# Patient Record
Sex: Female | Born: 1970 | Race: White | Hispanic: No | Marital: Single | Smoking: Never smoker
Health system: Southern US, Community
[De-identification: ages and names within clinical notes are randomized; demographics above are authoritative.]

## PROBLEM LIST (undated history)

## (undated) DIAGNOSIS — C44509 Unspecified malignant neoplasm of skin of other part of trunk: Secondary | ICD-10-CM

## (undated) DIAGNOSIS — N879 Dysplasia of cervix uteri, unspecified: Secondary | ICD-10-CM

## (undated) DIAGNOSIS — C801 Malignant (primary) neoplasm, unspecified: Secondary | ICD-10-CM

## (undated) HISTORY — PX: PELVIC LAPAROSCOPY: SHX162

## (undated) HISTORY — PX: SKIN CANCER EXCISION: SHX779

## (undated) HISTORY — DX: Malignant (primary) neoplasm, unspecified: C80.1

## (undated) HISTORY — PX: CERVICAL BIOPSY  W/ LOOP ELECTRODE EXCISION: SUR135

## (undated) HISTORY — DX: Unspecified malignant neoplasm of skin of other part of trunk: C44.509

## (undated) HISTORY — DX: Dysplasia of cervix uteri, unspecified: N87.9

---

## 1998-07-15 ENCOUNTER — Other Ambulatory Visit: Admission: RE | Admit: 1998-07-15 | Discharge: 1998-07-15 | Payer: Self-pay | Admitting: Gynecology

## 1998-12-15 ENCOUNTER — Other Ambulatory Visit: Admission: RE | Admit: 1998-12-15 | Discharge: 1998-12-15 | Payer: Self-pay | Admitting: Gynecology

## 1999-07-21 ENCOUNTER — Other Ambulatory Visit: Admission: RE | Admit: 1999-07-21 | Discharge: 1999-07-21 | Payer: Self-pay | Admitting: Gynecology

## 1999-08-14 ENCOUNTER — Encounter (INDEPENDENT_AMBULATORY_CARE_PROVIDER_SITE_OTHER): Payer: Self-pay

## 1999-08-14 ENCOUNTER — Other Ambulatory Visit: Admission: RE | Admit: 1999-08-14 | Discharge: 1999-08-14 | Payer: Self-pay | Admitting: Gynecology

## 1999-10-09 ENCOUNTER — Encounter (INDEPENDENT_AMBULATORY_CARE_PROVIDER_SITE_OTHER): Payer: Self-pay | Admitting: Specialist

## 1999-10-09 ENCOUNTER — Other Ambulatory Visit: Admission: RE | Admit: 1999-10-09 | Discharge: 1999-10-09 | Payer: Self-pay | Admitting: Gynecology

## 1999-11-20 ENCOUNTER — Encounter (INDEPENDENT_AMBULATORY_CARE_PROVIDER_SITE_OTHER): Payer: Self-pay | Admitting: Specialist

## 1999-11-20 ENCOUNTER — Other Ambulatory Visit: Admission: RE | Admit: 1999-11-20 | Discharge: 1999-11-20 | Payer: Self-pay | Admitting: Gynecology

## 2000-03-29 ENCOUNTER — Other Ambulatory Visit: Admission: RE | Admit: 2000-03-29 | Discharge: 2000-03-29 | Payer: Self-pay | Admitting: Gynecology

## 2000-10-04 ENCOUNTER — Other Ambulatory Visit: Admission: RE | Admit: 2000-10-04 | Discharge: 2000-10-04 | Payer: Self-pay | Admitting: Gynecology

## 2001-04-24 ENCOUNTER — Other Ambulatory Visit: Admission: RE | Admit: 2001-04-24 | Discharge: 2001-04-24 | Payer: Self-pay | Admitting: Gynecology

## 2001-10-10 ENCOUNTER — Other Ambulatory Visit: Admission: RE | Admit: 2001-10-10 | Discharge: 2001-10-10 | Payer: Self-pay | Admitting: Gynecology

## 2001-11-17 ENCOUNTER — Encounter (INDEPENDENT_AMBULATORY_CARE_PROVIDER_SITE_OTHER): Payer: Self-pay | Admitting: Specialist

## 2001-11-17 ENCOUNTER — Ambulatory Visit (HOSPITAL_COMMUNITY): Admission: RE | Admit: 2001-11-17 | Discharge: 2001-11-17 | Payer: Self-pay

## 2002-06-01 ENCOUNTER — Other Ambulatory Visit: Admission: RE | Admit: 2002-06-01 | Discharge: 2002-06-01 | Payer: Self-pay | Admitting: Gynecology

## 2003-06-06 ENCOUNTER — Other Ambulatory Visit: Admission: RE | Admit: 2003-06-06 | Discharge: 2003-06-06 | Payer: Self-pay | Admitting: Gynecology

## 2004-05-21 ENCOUNTER — Ambulatory Visit: Payer: Self-pay | Admitting: Gynecology

## 2004-06-08 ENCOUNTER — Other Ambulatory Visit: Admission: RE | Admit: 2004-06-08 | Discharge: 2004-06-08 | Payer: Self-pay | Admitting: Gynecology

## 2005-06-10 ENCOUNTER — Other Ambulatory Visit: Admission: RE | Admit: 2005-06-10 | Discharge: 2005-06-10 | Payer: Self-pay | Admitting: Gynecology

## 2006-08-25 ENCOUNTER — Other Ambulatory Visit: Admission: RE | Admit: 2006-08-25 | Discharge: 2006-08-25 | Payer: Self-pay | Admitting: Gynecology

## 2007-08-28 ENCOUNTER — Other Ambulatory Visit: Admission: RE | Admit: 2007-08-28 | Discharge: 2007-08-28 | Payer: Self-pay | Admitting: Gynecology

## 2008-08-29 ENCOUNTER — Other Ambulatory Visit: Admission: RE | Admit: 2008-08-29 | Discharge: 2008-08-29 | Payer: Self-pay | Admitting: Gynecology

## 2008-08-30 ENCOUNTER — Ambulatory Visit: Payer: Self-pay | Admitting: Gynecology

## 2009-04-17 ENCOUNTER — Encounter: Payer: Self-pay | Admitting: Family Medicine

## 2009-09-02 ENCOUNTER — Other Ambulatory Visit: Admission: RE | Admit: 2009-09-02 | Discharge: 2009-09-02 | Payer: Self-pay | Admitting: Gynecology

## 2009-09-02 ENCOUNTER — Ambulatory Visit: Payer: Self-pay | Admitting: Gynecology

## 2010-04-02 NOTE — Letter (Signed)
Summary: Depression Questionnaire/[place] Kathryne Sharper  Depression Questionnaire/ Kathryne Sharper   Imported By: Lanelle Bal 04/22/2009 13:55:16  _____________________________________________________________________  External Attachment:    Type:   Image     Comment:   External Document

## 2010-06-25 ENCOUNTER — Ambulatory Visit (INDEPENDENT_AMBULATORY_CARE_PROVIDER_SITE_OTHER): Payer: 59 | Admitting: Gynecology

## 2010-06-25 DIAGNOSIS — R350 Frequency of micturition: Secondary | ICD-10-CM

## 2010-06-25 DIAGNOSIS — B373 Candidiasis of vulva and vagina: Secondary | ICD-10-CM

## 2010-06-25 DIAGNOSIS — N898 Other specified noninflammatory disorders of vagina: Secondary | ICD-10-CM

## 2010-06-25 DIAGNOSIS — R82998 Other abnormal findings in urine: Secondary | ICD-10-CM

## 2010-07-17 NOTE — Op Note (Signed)
NAME:  Cheyenne Holmes, Cheyenne Holmes                            ACCOUNT NO.:  [id number]   MEDICAL RECORD NO.:  [id number]                   PATIENT TYPE:  AMB   LOCATION:  SDC                                  FACILITY:  [facility]   PHYSICIAN:  Timothy P. Fontaine, M.D.           DATE OF BIRTH:  Oct 01, 1970   DATE OF PROCEDURE:  11/17/2001  DATE OF DISCHARGE:                                 OPERATIVE REPORT   PREOPERATIVE DIAGNOSIS:  Endometriosis.   POSTOPERATIVE DIAGNOSIS:  Endometriosis.   PROCEDURES:  Laparoscopic biopsy, fulguration endometriosis.   SURGEON:  Timothy P. Fontaine, M.D.   ANESTHESIA:  General endotracheal.   COMPLICATIONS:  None.   ESTIMATED BLOOD LOSS:  Minimal.   SPECIMENS:  Peritoneal biopsy x3.   FINDINGS:  Anterior cul-de-sac normal.  Posterior cul-de-sac with several  small, red, shaggy, classic endometriotic implants, mid cul-de-sac,  representative of area biopsied and all visible areas subsequently bipolar  cauterized in a brushing technique.  Uterus grossly normal size, shape.  Right and left fallopian tubes normal length and caliber, fimbriated end.  Right and left ovaries grossly normal, freely mobile, no evidence of  endometriosis.  Right pelvic side wall classic powder-burn lesion superior  to the ureter, mid side wall level of the ovary representative of area  biopsied and surrounding peritoneal reflection bipolar cauterized in a  brushing technique.  Left pelvic side wall high under ovary fibrotic, white-  appearing area, questionable fibrosis versus white endometriosis  representative of area biopsied and the area was subsequently bipolar  cauterized.  The appendix grossly normal, adherent to the right flank, not  free and mobile.  Liver smooth, no abnormalities.  Gallbladder not  visualized.   PROCEDURE:  The patient was taken to the operating room, underwent general  endotracheal anesthesia, was placed in low dorsal lithotomy position,  received an  abdominal, perineal, vaginal preparation with Betadine solution.  The bladder emptied with in-and-out Foley catheterization.  EUA performed  and a Hulka tenaculum placed on the cervix.  The patient was draped in the  usual fashion.  A midline vertical infraumbilical incision was made.  The  Veress needle was placed.  Its position verified with water and  approximately 3 liters of carbon dioxide gas was infused without difficulty.  A 10-mm laparoscopic trocar was then placed without difficulty, its position  verified visually.  Right and left suprapubic 5-mm ports were then placed  under direct visualization after transillumination of the vessels without  difficulty.  Examination of the pelvic organs, upper abdominal exam was  carried out with findings noted above.  Representative biopsies of the  right, left pelvic side walls and the cul-de-sac were taken and sent  together.  Care was taken to identify the ureters on both sides and the  sites were away from the ureteral locations.  Subsequently, bipolar cautery  was applied to the peritoneal surfaces at the areas of biopsy  to achieve  hemostasis and to obliterate any remaining abnormal areas.  This was done in  a superficial brushing technique.  The pelvis was irrigated.  Adequate  hemostasis visualized.  The suprapubic ports removed.  The gas allowed to  escape.  All sites reinspected under low pressure situation showing adequate  hemostasis and the infraumbilical port was backed out under direct  visualization showing adequate hemostasis and no evidence of hernia  formation.  An infraumbilical 0 Vicryl subcutaneous fascial stitch was  placed and all skin incisions were subsequently injected using 0.25%  Marcaine and subsequently reapproximated using Dermabond skin adhesive.  The  Hulka tenaculum was removed.  The patient placed in supine position,  awakened without difficulty, and taken to the recovery room in good  condition having  tolerated the procedure well.                                               Timothy P. Audie Box, M.D.    TPF/MEDQ  D:  11/17/2001  T:  11/18/2001  Job:  [id number]

## 2010-07-17 NOTE — H&P (Signed)
NAME:  Cheyenne Holmes, Cheyenne Holmes                            ACCOUNT NO.:  [id number]   MEDICAL RECORD NO.:  [id number]                   PATIENT TYPE:  AMB   LOCATION:  SDC                                  FACILITY:  [facility]   PHYSICIAN:  Timothy P. Fontaine, M.D.           DATE OF BIRTH:  01-Jun-1970   DATE OF ADMISSION:  11/17/2001  DATE OF DISCHARGE:                                HISTORY & PHYSICAL   CHIEF COMPLAINT:  Increasing dysmenorrhea.   HISTORY OF PRESENT ILLNESS:  [age]-year-old G0, Loestrin 1/20 oral  contraceptives, with history of increasing dysmenorrhea that is becoming  disabling to the patient.  The patient is status post laparoscopic diagnoses  of endometriosis in 1998 treated with laser therapy with good relief and has  most recently noted a return of her symptoms.  The patient is admitted at  this time for laser view laparoscopy.   PAST MEDICAL HISTORY:  None.   PAST SURGICAL HISTORY:  LEEP biopsy of the cervix times two.  Laparoscopy.   MEDICATIONS:  Loestrin 1/20.   ALLERGIES:  None.   REVIEW OF SYSTEMS:  Noncontributory.   FAMILY HISTORY:  Noncontributory.   SOCIAL HISTORY:  Noncontributory.   ADMISSION PHYSICAL EXAMINATION:   VITAL SIGNS:  Afebrile.  Vital signs are stable.   HEENT:  Normal.   LUNGS:  Clear.   CARDIAC:  Regular rate without rubs, murmurs or gallops.   ABDOMINAL EXAMINATION:  Benign.   PELVIC:  External, BUS and vagina normal.  Cervix normal.  Uterus normal  size, nontender.  Adnexa without masses or tenderness.   ASSESSMENT:  [age]-year-old gravida 0, laparoscopic proven  endometriosis on oral contraceptives with increasing dysmenorrhea becoming  disabling to the patient.   Evaluation includes a normal physical exam as well as a normal ultrasound.  The patient is admitted at this time for laser video laparoscopy.  Risks,  benefits, indications and alternatives for the procedure were reviewed with  the patient to include the  expected intraoperative and postoperative  courses.  No guarantees as far as pain relief were made for the patient.  She understands that during the procedure that we may find pathology that we  can address, we may find pathology that we cannot address or we may find no  pathology.  She may continue to have pain following the procedure.  She may  had different pain or worsening pain following the procedure; and, she  understands and accepts these possibilities.  The patient has previously  undergone a similar procedure and I reviewed again with her what is involved  with the procedure to include laparoscopy, insufflation, trocar placement,  sharp blunt dissection, use of electrocautery as well as use of laser.  The  risks of inadvertent injury to internal organs including bowel, bladder,  ureters, vessels and nerves necessitating major exploratory reparative  surgeries and future reparative surgeries including ostomy formation were  discussed, understood  and accepted.  The risks of major vessel injury with  hemorrhage and the risks of transfusion were discussed to include  transfusion reaction, hepatitis, HIV, mad cow disease and other unknown  entities.  The risks of infection including incisional infections requiring  opening and draining of incisions as well as internal infections requiring  prolonged antibiotics were discussed, understood and accepted.  The patient,  again, understands there are no guarantees as far as pain relief is  concerned.  Her questions were answered to her satisfaction.  She is ready  to proceed with surgery.                                                   Timothy P. Audie Box, M.D.    TPF/MEDQ  D:  11/14/2001  T:  11/14/2001  Job:  [id number]

## 2010-09-04 ENCOUNTER — Other Ambulatory Visit (HOSPITAL_COMMUNITY)
Admission: RE | Admit: 2010-09-04 | Discharge: 2010-09-04 | Disposition: A | Discharge status: Home / Self Care | Payer: 59 | Source: Ambulatory Visit | Attending: Gynecology | Admitting: Gynecology

## 2010-09-04 ENCOUNTER — Encounter (INDEPENDENT_AMBULATORY_CARE_PROVIDER_SITE_OTHER): Payer: 59 | Admitting: Gynecology

## 2010-09-04 ENCOUNTER — Other Ambulatory Visit: Payer: Self-pay | Admitting: Gynecology

## 2010-09-04 DIAGNOSIS — Z124 Encounter for screening for malignant neoplasm of cervix: Secondary | ICD-10-CM | POA: Insufficient documentation

## 2010-09-04 DIAGNOSIS — Z13220 Encounter for screening for lipoid disorders: Secondary | ICD-10-CM

## 2010-09-04 DIAGNOSIS — Z01419 Encounter for gynecological examination (general) (routine) without abnormal findings: Secondary | ICD-10-CM

## 2010-09-04 DIAGNOSIS — Z833 Family history of diabetes mellitus: Secondary | ICD-10-CM

## 2010-10-09 ENCOUNTER — Other Ambulatory Visit: Payer: Self-pay | Admitting: Gynecology

## 2010-10-09 ENCOUNTER — Telehealth: Payer: Self-pay | Admitting: *Deleted

## 2010-10-09 DIAGNOSIS — Z1231 Encounter for screening mammogram for malignant neoplasm of breast: Secondary | ICD-10-CM

## 2010-10-09 NOTE — Telephone Encounter (Signed)
PT GIVEN NUMBER TO [facility] FOR SCREENING MAMMOGRAM.

## 2010-10-16 ENCOUNTER — Ambulatory Visit
Admission: RE | Admit: 2010-10-16 | Discharge: 2010-10-16 | Disposition: A | Discharge status: Home / Self Care | Payer: 59 | Source: Ambulatory Visit | Attending: Gynecology | Admitting: Gynecology

## 2010-10-16 DIAGNOSIS — Z1231 Encounter for screening mammogram for malignant neoplasm of breast: Secondary | ICD-10-CM

## 2010-11-05 ENCOUNTER — Ambulatory Visit (INDEPENDENT_AMBULATORY_CARE_PROVIDER_SITE_OTHER): Payer: 59 | Admitting: Gynecology

## 2010-11-05 DIAGNOSIS — B373 Candidiasis of vulva and vagina: Secondary | ICD-10-CM

## 2010-11-05 DIAGNOSIS — N898 Other specified noninflammatory disorders of vagina: Secondary | ICD-10-CM

## 2010-11-05 DIAGNOSIS — R82998 Other abnormal findings in urine: Secondary | ICD-10-CM

## 2010-11-05 DIAGNOSIS — N390 Urinary tract infection, site not specified: Secondary | ICD-10-CM

## 2010-11-05 MED ORDER — FLUCONAZOLE 200 MG PO TABS: 200.0000 mg | ORAL_TABLET | Freq: Every day | ORAL | Status: AC

## 2010-11-05 MED ORDER — SULFAMETHOXAZOLE-TRIMETHOPRIM 800-160 MG PO TABS: 1.0000 | ORAL_TABLET | Freq: Two times a day (BID) | ORAL | Status: AC

## 2010-11-05 MED ORDER — PHENAZOPYRIDINE HCL 200 MG PO TABS: 200.0000 mg | ORAL_TABLET | Freq: Three times a day (TID) | ORAL | Status: DC | PRN

## 2010-11-05 NOTE — Progress Notes (Signed)
Patient presents with several day history of frequency dysuria urgency. She also notes vaginal discharge with itching. She took one Diflucan at home but still has persistence of vaginal itching.  Exam Abdomen: Soft mild suprapubic tenderness without masses guarding rebound organomegaly Pelvic: External BUS vagina with discharge KOH wet prep done cervix normal bimanual uterus normal size midline mobile nontender adnexa without masses or tenderness   Assessment and plan:  #1 UTI. UA consistent with UTI will treat with Septra DS 1 by mouth twice a day x3 days and Pyridium 200 mg 3 times a day x5 days as needed. Followup if symptoms persist or recur #2 White discharge. KOH was positive for yeast we'll treat with Diflucan 200 daily for 3 days follow up if symptoms persist or recur

## 2010-11-05 NOTE — Progress Notes (Signed)
Addended byCammie Mcgee T on: 11/05/2010 02:32 PM   Modules accepted: Orders

## 2011-01-19 ENCOUNTER — Ambulatory Visit (INDEPENDENT_AMBULATORY_CARE_PROVIDER_SITE_OTHER): Payer: 59 | Admitting: Gynecology

## 2011-01-19 ENCOUNTER — Encounter: Payer: Self-pay | Admitting: Gynecology

## 2011-01-19 VITALS — BP 130/70

## 2011-01-19 DIAGNOSIS — N926 Irregular menstruation, unspecified: Secondary | ICD-10-CM

## 2011-01-19 LAB — POCT URINE PREGNANCY: Preg Test, Ur: NEGATIVE

## 2011-01-19 NOTE — Progress Notes (Signed)
Patient presents with last menstrual period in September with a positive home pregnancy test. She is on oral contraceptives has not missed any but does note that she has frequent skips in her periods while being on the pills.  Exam External BUS vagina normal. Cervix normal. Uterus anteverted normal size midline mobile nontender. Adnexa without masses or tenderness  Assessment and plan: Irregular menses on BCPs. UPT here is negative. We'll verify with a qualitative serum hCG. I discussed not in usual a low dose pill to skip menses. She's very consistent in taking them she is comfortable with missing a period and will continue on the oral contraceptives.

## 2011-02-14 ENCOUNTER — Encounter (HOSPITAL_BASED_OUTPATIENT_CLINIC_OR_DEPARTMENT_OTHER): Payer: Self-pay | Admitting: Emergency Medicine

## 2011-02-14 ENCOUNTER — Emergency Department (INDEPENDENT_AMBULATORY_CARE_PROVIDER_SITE_OTHER): Payer: 59

## 2011-02-14 ENCOUNTER — Emergency Department (HOSPITAL_BASED_OUTPATIENT_CLINIC_OR_DEPARTMENT_OTHER)
Admission: EM | Admit: 2011-02-14 | Discharge: 2011-02-14 | Disposition: A | Discharge status: Home / Self Care | Payer: 59 | Attending: Emergency Medicine | Admitting: Emergency Medicine

## 2011-02-14 DIAGNOSIS — IMO0002 Reserved for concepts with insufficient information to code with codable children: Secondary | ICD-10-CM | POA: Insufficient documentation

## 2011-02-14 DIAGNOSIS — S52609A Unspecified fracture of lower end of unspecified ulna, initial encounter for closed fracture: Secondary | ICD-10-CM

## 2011-02-14 DIAGNOSIS — Y9289 Other specified places as the place of occurrence of the external cause: Secondary | ICD-10-CM | POA: Insufficient documentation

## 2011-02-14 DIAGNOSIS — X58XXXA Exposure to other specified factors, initial encounter: Secondary | ICD-10-CM

## 2011-02-14 DIAGNOSIS — M25549 Pain in joints of unspecified hand: Secondary | ICD-10-CM

## 2011-02-14 DIAGNOSIS — S6990XA Unspecified injury of unspecified wrist, hand and finger(s), initial encounter: Secondary | ICD-10-CM

## 2011-02-14 MED ORDER — OXYCODONE-ACETAMINOPHEN 5-325 MG PO TABS: 1.0000 | ORAL_TABLET | ORAL | Status: AC | PRN

## 2011-02-14 MED ORDER — OXYCODONE-ACETAMINOPHEN 5-325 MG PO TABS
2.0000 | ORAL_TABLET | Freq: Once | ORAL | Status: AC
  Administered 2011-02-14: 2 via ORAL
  Filled 2011-02-14: qty 2

## 2011-02-14 NOTE — ED Notes (Signed)
Care and use of Meds reviewed

## 2011-02-14 NOTE — ED Provider Notes (Signed)
History     CSN: [id number] Arrival date & time: 02/14/2011 11:04 AM   First MD Initiated Contact with Patient 02/14/11 1106     11:45 AM HPI Patient states she was shy to loosen a tree from her camper when a branch swung back and hit her left hand. Reports severe pain in her hand. States pain is most severe in thumb and radiates out from there to the entire hand. Reports able to wiggle fingers but not in the fingers. Unable to bend do to severe pain. Patient is a 40 y.o. female presenting with hand injury. The history is provided by the patient.  Hand Injury  The incident occurred 1 to 2 hours ago. The incident occurred at home. The injury mechanism was a direct blow. The pain is present in the left hand. The pain is severe. The pain has been constant since the incident. She reports no foreign bodies present. The symptoms are aggravated by movement, palpation and use. She has tried nothing for the symptoms.    Past Medical History  Diagnosis Date  . Endometriosis   . LGSIL (low grade squamous intraepithelial dysplasia)     hx of lgsil    Past Surgical History  Procedure Date  . Cervical biopsy  w/ loop electrode excision     x 2  . Pelvic laparoscopy 11/17/2001    laparoscopy bx, fulguration endometriosis    Family History  Problem Relation Age of Onset  . Breast cancer Mother     10's    History  Substance Use Topics  . Smoking status: Never Smoker   . Smokeless tobacco: Not on file  . Alcohol Use: No    OB History    Grav Para Term Preterm Abortions TAB SAB Ect Mult Living                  Review of Systems  Musculoskeletal:       Hand injury  Skin: Negative for color change and wound.  All other systems reviewed and are negative.    Allergies  Review of patient's allergies indicates no known allergies.  Home Medications   Current Outpatient Rx  Name Route Sig Dispense Refill  . IBUPROFEN 800 MG PO TABS Oral Take 800 mg by mouth every 8 (eight) hours  as needed.      Azzie Roup ACE-ETH ESTRAD-FE 1-20 MG-MCG PO TABS Oral Take 1 tablet by mouth daily.      Marland Kitchen PHENAZOPYRIDINE HCL 200 MG PO TABS Oral Take 1 tablet (200 mg total) by mouth 3 (three) times daily as needed for pain. 15 tablet 0    BP 115/67  Pulse 82  Temp(Src) 98.6 F (37 C) (Oral)  Resp 16  SpO2 100%  LMP 02/09/2011  Physical Exam  Vitals reviewed. Constitutional: She is oriented to person, place, and time. Vital signs are normal. She appears well-developed and well-nourished. No distress.  HENT:  Head: Normocephalic and atraumatic.  Eyes: Pupils are equal, round, and reactive to light.  Neck: Neck supple.  Pulmonary/Chest: Effort normal.  Musculoskeletal:       Left hand: She exhibits decreased range of motion, tenderness, bony tenderness and swelling. She exhibits normal capillary refill, no deformity and no laceration. normal sensation noted. Decreased strength noted. She exhibits finger abduction and thumb/finger opposition. She exhibits no wrist extension trouble.       Hands: Neurological: She is alert and oriented to person, place, and time.  Skin: Skin is warm and dry.  No rash noted. No erythema. No pallor.  Psychiatric: She has a normal mood and affect. Her behavior is normal.    ED Course  Procedures  Dg Hand Complete Left  02/14/2011  *RADIOLOGY REPORT*  Clinical Data: Left hand pain  LEFT HAND - COMPLETE 3+ VIEW  Comparison: None  Findings: Several well corticated ossific densities are noted adjacent to the ulnar styloid consistent with chronic fracture.  There is no acute fracture or subluxation identified.  There are no radio-opaque foreign bodies or soft tissue calcifications identified.  IMPRESSION:  1.  No acute findings. 2.  Old ulnar styloid fractures.  Original Report Authenticated By: Rosealee Albee, M.D.       MDM   Suspect contusion of left hand. Will prescribe pain medication to go home advised cool and warm compresses. I provided  patient with the orthopedic referral for persistent pain.       Thomasene Lot, [place] 02/14/11 [phone]

## 2011-02-14 NOTE — ED Notes (Signed)
Pt blocked face with hand from limb of tree.  Pt c/o pain to left hand and left upper arm.  Good CMS.

## 2011-02-15 NOTE — ED Provider Notes (Signed)
Medical screening examination/treatment/procedure(s) were performed by non-physician practitioner and as supervising physician I was immediately available for consultation/collaboration.  Gerhard Munch, MD 02/15/11 [phone]

## 2011-09-09 ENCOUNTER — Ambulatory Visit (INDEPENDENT_AMBULATORY_CARE_PROVIDER_SITE_OTHER): Payer: 59 | Admitting: Gynecology

## 2011-09-09 ENCOUNTER — Encounter: Payer: Self-pay | Admitting: Gynecology

## 2011-09-09 VITALS — BP 116/60 | Ht 68.0 in | Wt 160.0 lb

## 2011-09-09 DIAGNOSIS — Z01419 Encounter for gynecological examination (general) (routine) without abnormal findings: Secondary | ICD-10-CM

## 2011-09-09 MED ORDER — NORETHIN ACE-ETH ESTRAD-FE 1-20 MG-MCG PO TABS: 1.0000 | ORAL_TABLET | Freq: Every day | ORAL | Status: DC

## 2011-09-09 NOTE — Patient Instructions (Signed)
Follow up in one year for annual gynecologic exam. 

## 2011-09-09 NOTE — Progress Notes (Signed)
Cheyenne Holmes [birth year] [id number]        41 y.o.  for annual exam.  Doing well.  Past medical history,surgical history, medications, allergies, family history and social history were all reviewed and documented in the EPIC chart. ROS:  Was performed and pertinent positives and negatives are included in the history.  Exam: Sherrilyn Rist assistant Filed Vitals:   09/09/11 0902  BP: 116/60   General appearance  Normal Skin grossly normal Head/Neck normal with no cervical or supraclavicular adenopathy thyroid normal Lungs  clear Cardiac RR, without RMG Abdominal  soft, nontender, without masses, organomegaly or hernia Breasts  examined lying and sitting without masses, retractions, discharge or axillary adenopathy. Pelvic  Ext/BUS/vagina  normal   Cervix  normal   Uterus  anteverted, normal size, shape and contour, midline and mobile nontender   Adnexa  Without masses or tenderness    Anus and perineum  normal   Rectovaginal  normal sphincter tone without palpated masses or tenderness.    Assessment/Plan:  42 y.o. G0 female for annual exam.    1. Contraception. Patients on low-dose oral contraceptives doing well with regular menses. Reviewed risks with advancing age to include stroke heart attack DVT. She does not smoke and is otherwise healthy and wants to continue and I refilled her times a year. 2. History laparoscopic endometriosis. Laparoscopy with fulguration of endometriosis times 04/19/1996 and 2003.  Patient is asymptomatic. We'll continue to monitor. 3. History dysplasia. 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 Annual cytologies have all been normal since then. Last Pap smear 2012. Pap smear was not done today. I reviewed current screening guidelines we'll plan less frequent screening every 3-5 years. 4. Mammography. Patients due for mammogram. Recommend continuing annual mammography and she will schedule. SBE monthly reviewed. 5. Health maintenance. No lab  work was done today. She has normal CBC, glucose, lipid profile and urinalysis in 2012. Assuming she continues well from a gynecologic standpoint she will see me in a year, sooner as needed.   Dara Lords MD, 10:21 AM 09/09/2011

## 2011-09-25 ENCOUNTER — Other Ambulatory Visit: Payer: Self-pay | Admitting: Gynecology

## 2011-12-10 ENCOUNTER — Other Ambulatory Visit: Payer: Self-pay | Admitting: Gynecology

## 2011-12-10 DIAGNOSIS — Z1231 Encounter for screening mammogram for malignant neoplasm of breast: Secondary | ICD-10-CM

## 2011-12-17 ENCOUNTER — Telehealth: Payer: Self-pay | Admitting: *Deleted

## 2011-12-17 NOTE — Telephone Encounter (Signed)
Pt called requesting to have BRCA1 & BRAC2 testing, called pt and gave her # to [facility] make appointment.

## 2012-01-06 ENCOUNTER — Telehealth: Payer: Self-pay | Admitting: *Deleted

## 2012-01-06 MED ORDER — SULFAMETHOXAZOLE-TRIMETHOPRIM 800-160 MG PO TABS: 1.0000 | ORAL_TABLET | Freq: Two times a day (BID) | ORAL | Status: DC

## 2012-01-06 NOTE — Telephone Encounter (Signed)
Septra DS 1 by mouth twice a day x3 days 

## 2012-01-06 NOTE — Telephone Encounter (Signed)
Pt called c/o UTI symptoms this am. C/o urgency, burning with urination, pt also noticed blood on tissue when wiping. Pt is out town at her parent home. She cant make OV because she will be taking a flight in the early am. She is requesting a Rx for this UTI.

## 2012-01-06 NOTE — Telephone Encounter (Signed)
Pt informed with the below note. 

## 2012-01-12 ENCOUNTER — Ambulatory Visit
Admission: RE | Admit: 2012-01-12 | Discharge: 2012-01-12 | Disposition: A | Discharge status: Home / Self Care | Payer: 59 | Source: Ambulatory Visit | Attending: Gynecology | Admitting: Gynecology

## 2012-01-12 DIAGNOSIS — Z1231 Encounter for screening mammogram for malignant neoplasm of breast: Secondary | ICD-10-CM

## 2012-02-13 ENCOUNTER — Other Ambulatory Visit: Payer: Self-pay | Admitting: Gynecology

## 2012-09-11 ENCOUNTER — Other Ambulatory Visit: Payer: Self-pay | Admitting: Gynecology

## 2012-09-12 ENCOUNTER — Ambulatory Visit (INDEPENDENT_AMBULATORY_CARE_PROVIDER_SITE_OTHER): Payer: 59 | Admitting: Gynecology

## 2012-09-12 ENCOUNTER — Encounter: Payer: Self-pay | Admitting: Gynecology

## 2012-09-12 VITALS — BP 120/74 | Ht 68.5 in | Wt 154.0 lb

## 2012-09-12 DIAGNOSIS — Z309 Encounter for contraceptive management, unspecified: Secondary | ICD-10-CM

## 2012-09-12 DIAGNOSIS — Z01419 Encounter for gynecological examination (general) (routine) without abnormal findings: Secondary | ICD-10-CM

## 2012-09-12 DIAGNOSIS — Z13220 Encounter for screening for lipoid disorders: Secondary | ICD-10-CM

## 2012-09-12 LAB — CBC WITH DIFFERENTIAL/PLATELET
Basophils Absolute: 0.1 10*3/uL (ref 0.0–0.1)
Basophils Relative: 1 % (ref 0–1)
Eosinophils Absolute: 0.1 10*3/uL (ref 0.0–0.7)
Hemoglobin: 14.9 g/dL (ref 12.0–15.0)
MCH: 31.2 pg (ref 26.0–34.0)
MCHC: 33.6 g/dL (ref 30.0–36.0)
Monocytes Relative: 6 % (ref 3–12)
Neutro Abs: 3.6 10*3/uL (ref 1.7–7.7)
Neutrophils Relative %: 52 % (ref 43–77)
Platelets: 248 10*3/uL (ref 150–400)
RDW: 13.2 % (ref 11.5–15.5)

## 2012-09-12 LAB — COMPREHENSIVE METABOLIC PANEL
AST: 19 U/L (ref 0–37)
Alkaline Phosphatase: 53 U/L (ref 39–117)
Glucose, Bld: 77 mg/dL (ref 70–99)
Sodium: 139 mEq/L (ref 135–145)
Total Bilirubin: 0.6 mg/dL (ref 0.3–1.2)
Total Protein: 6.9 g/dL (ref 6.0–8.3)

## 2012-09-12 LAB — LIPID PANEL
HDL: 60 mg/dL (ref >39)
LDL Cholesterol: 89 mg/dL (ref 0–99)
Triglycerides: 65 mg/dL (ref <150)
VLDL: 13 mg/dL (ref 0–40)

## 2012-09-12 MED ORDER — NORETHIN ACE-ETH ESTRAD-FE 1-20 MG-MCG PO TABS: ORAL_TABLET | ORAL | Status: DC

## 2012-09-12 NOTE — Progress Notes (Signed)
Cheyenne Holmes 1971-02-13 [id number]        42 y.o.  G0P0 for annual exam.  Doing well without complaints.  Past medical history,surgical history, medications, allergies, family history and social history were all reviewed and documented in the EPIC chart.  ROS:  Performed and pertinent positives and negatives are included in the history, assessment and plan .  Exam: Kim assistant Filed Vitals:   09/12/12 0859  BP: 120/74  Height: 5' 8.5" (1.74 m)  Weight: 154 lb (69.854 kg)   General appearance  Normal Skin grossly normal Head/Neck normal with no cervical or supraclavicular adenopathy thyroid normal Lungs  clear Cardiac RR, without RMG Abdominal  soft, nontender, without masses, organomegaly or hernia Breasts  examined lying and sitting without masses, retractions, discharge or axillary adenopathy. Pelvic  Ext/BUS/vagina  normal   Cervix  normal   Uterus  anteverted, normal size, shape and contour, midline and mobile nontender   Adnexa  Without masses or tenderness    Anus and perineum  normal   Rectovaginal  normal sphincter tone without palpated masses or tenderness.    Assessment/Plan:  42 y.o. G0P0 female for annual exam, regular menses, oral contraceptives.   1. Contraceptive management. Patient on Loestrin 120 equivalent doing well wants to continue. I reviewed the increased risk of stroke heart attack DVT with birth control pills. She does not smoke and has not been followed for any medical issues and wants to continue it I refilled her times a year. 2. Pap smear 2012. No Pap smear done today.  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 Annual cytologies have all been normal since then. Plan repeat Pap smear next year at 3 year interval. 3. Mammography 12/2011. Repeat mammogram this coming fall. SBE monthly reviewed. 4. History of endometriosis with laparoscopy x2. Doing well and asymptomatic. Continue to monitor. 5. Health maintenance. Baseline CBC  comprehensive metabolic panel lipid profile urinalysis ordered. Followup one year, sooner as needed.  Note: This document was prepared with digital dictation and possible smart phrase technology. Any transcriptional errors that result from this process are unintentional.   Dara Lords MD, 9:19 AM 09/12/2012

## 2012-09-12 NOTE — Patient Instructions (Signed)
Follow up in one year, sooner as needed. 

## 2012-09-13 LAB — URINALYSIS W MICROSCOPIC + REFLEX CULTURE
Bacteria, UA: NONE SEEN
Hgb urine dipstick: NEGATIVE
Ketones, ur: NEGATIVE mg/dL
Nitrite: NEGATIVE
Specific Gravity, Urine: 1.015 (ref 1.005–1.030)
Urobilinogen, UA: 0.2 mg/dL (ref 0.0–1.0)

## 2012-11-02 ENCOUNTER — Ambulatory Visit: Payer: Self-pay | Admitting: Unknown Physician Specialty

## 2012-12-26 ENCOUNTER — Other Ambulatory Visit: Payer: Self-pay

## 2012-12-26 DIAGNOSIS — Z1231 Encounter for screening mammogram for malignant neoplasm of breast: Secondary | ICD-10-CM

## 2013-01-24 ENCOUNTER — Ambulatory Visit
Admission: RE | Admit: 2013-01-24 | Discharge: 2013-01-24 | Disposition: A | Discharge status: Home / Self Care | Payer: 59 | Source: Ambulatory Visit

## 2013-01-24 DIAGNOSIS — Z1231 Encounter for screening mammogram for malignant neoplasm of breast: Secondary | ICD-10-CM

## 2013-09-25 ENCOUNTER — Other Ambulatory Visit (HOSPITAL_COMMUNITY)
Admission: RE | Admit: 2013-09-25 | Discharge: 2013-09-25 | Disposition: A | Discharge status: Home / Self Care | Payer: 59 | Source: Ambulatory Visit | Attending: Gynecology | Admitting: Gynecology

## 2013-09-25 ENCOUNTER — Ambulatory Visit (INDEPENDENT_AMBULATORY_CARE_PROVIDER_SITE_OTHER): Payer: 59 | Admitting: Gynecology

## 2013-09-25 ENCOUNTER — Telehealth: Payer: Self-pay | Admitting: *Deleted

## 2013-09-25 ENCOUNTER — Encounter: Payer: Self-pay | Admitting: Gynecology

## 2013-09-25 VITALS — BP 120/74 | Ht 68.0 in | Wt 163.0 lb

## 2013-09-25 DIAGNOSIS — Z1151 Encounter for screening for human papillomavirus (HPV): Secondary | ICD-10-CM | POA: Diagnosis present

## 2013-09-25 DIAGNOSIS — Z01419 Encounter for gynecological examination (general) (routine) without abnormal findings: Secondary | ICD-10-CM | POA: Diagnosis present

## 2013-09-25 MED ORDER — NORETHIN ACE-ETH ESTRAD-FE 1-20 MG-MCG PO TABS: ORAL_TABLET | ORAL | Status: DC

## 2013-09-25 MED ORDER — VALACYCLOVIR HCL 1 G PO TABS: ORAL_TABLET | ORAL | Status: DC

## 2013-09-25 NOTE — Patient Instructions (Addendum)
Take Valtrex 2 tablets at earliest onset of fever blister and repeated 12 hours later. Call me if you continue have any issues with the fever blisters. Followup in one year for annual exam  You may obtain a copy of any labs that were done today by logging onto MyChart as outlined in the instructions provided with your AVS (after visit summary). The office will not call with normal lab results but certainly if there are any significant abnormalities then we will contact you.   Health Maintenance, Female A healthy lifestyle and preventative care can promote health and wellness.  Maintain regular health, dental, and eye exams.  Eat a healthy diet. Foods like vegetables, fruits, whole grains, low-fat dairy products, and lean protein foods contain the nutrients you need without too many calories. Decrease your intake of foods high in solid fats, added sugars, and salt. Get information about a proper diet from your caregiver, if necessary.  Regular physical exercise is one of the most important things you can do for your health. Most adults should get at least 150 minutes of moderate-intensity exercise (any activity that increases your heart rate and causes you to sweat) each week. In addition, most adults need muscle-strengthening exercises on 2 or more days a week.   Maintain a healthy weight. The body mass index (BMI) is a screening tool to identify possible weight problems. It provides an estimate of body fat based on height and weight. Your caregiver can help determine your BMI, and can help you achieve or maintain a healthy weight. For adults 20 years and older:  A BMI below 18.5 is considered underweight.  A BMI of 18.5 to 24.9 is normal.  A BMI of 25 to 29.9 is considered overweight.  A BMI of 30 and above is considered obese.  Maintain normal blood lipids and cholesterol by exercising and minimizing your intake of saturated fat. Eat a balanced diet with plenty of fruits and vegetables.  Blood tests for lipids and cholesterol should begin at age 32 and be repeated every 5 years. If your lipid or cholesterol levels are high, you are over 50, or you are a high risk for heart disease, you may need your cholesterol levels checked more frequently.Ongoing high lipid and cholesterol levels should be treated with medicines if diet and exercise are not effective.  If you smoke, find out from your caregiver how to quit. If you do not use tobacco, do not start.  Lung cancer screening is recommended for adults aged 61 80 years who are at high risk for developing lung cancer because of a history of smoking. Yearly low-dose computed tomography (CT) is recommended for people who have at least a 30-pack-year history of smoking and are a current smoker or have quit within the past 15 years. A pack year of smoking is smoking an average of 1 pack of cigarettes a day for 1 year (for example: 1 pack a day for 30 years or 2 packs a day for 15 years). Yearly screening should continue until the smoker has stopped smoking for at least 15 years. Yearly screening should also be stopped for people who develop a health problem that would prevent them from having lung cancer treatment.  If you are pregnant, do not drink alcohol. If you are breastfeeding, be very cautious about drinking alcohol. If you are not pregnant and choose to drink alcohol, do not exceed 1 drink per day. One drink is considered to be 12 ounces (355 mL) of beer, 5  ounces (148 mL) of wine, or 1.5 ounces (44 mL) of liquor.  Avoid use of street drugs. Do not share needles with anyone. Ask for help if you need support or instructions about stopping the use of drugs.  High blood pressure causes heart disease and increases the risk of stroke. Blood pressure should be checked at least every 1 to 2 years. Ongoing high blood pressure should be treated with medicines, if weight loss and exercise are not effective.  If you are 39 to 44 years old, ask your  caregiver if you should take aspirin to prevent strokes.  Diabetes screening involves taking a blood sample to check your fasting blood sugar level. This should be done once every 3 years, after age 35, if you are within normal weight and without risk factors for diabetes. Testing should be considered at a younger age or be carried out more frequently if you are overweight and have at least 1 risk factor for diabetes.  Breast cancer screening is essential preventative care for women. You should practice "breast self-awareness." This means understanding the normal appearance and feel of your breasts and may include breast self-examination. Any changes detected, no matter how small, should be reported to a caregiver. Women in their 56s and 30s should have a clinical breast exam (CBE) by a caregiver as part of a regular health exam every 1 to 3 years. After age 77, women should have a CBE every year. Starting at age 48, women should consider having a mammogram (breast X-ray) every year. Women who have a family history of breast cancer should talk to their caregiver about genetic screening. Women at a high risk of breast cancer should talk to their caregiver about having an MRI and a mammogram every year.  Breast cancer gene (BRCA)-related cancer risk assessment is recommended for women who have family members with BRCA-related cancers. BRCA-related cancers include breast, ovarian, tubal, and peritoneal cancers. Having family members with these cancers may be associated with an increased risk for harmful changes (mutations) in the breast cancer genes BRCA1 and BRCA2. Results of the assessment will determine the need for genetic counseling and BRCA1 and BRCA2 testing.  The Pap test is a screening test for cervical cancer. Women should have a Pap test starting at age 67. Between ages 63 and 1, Pap tests should be repeated every 2 years. Beginning at age 52, you should have a Pap test every 3 years as long as the  past 3 Pap tests have been normal. If you had a hysterectomy for a problem that was not cancer or a condition that could lead to cancer, then you no longer need Pap tests. If you are between ages 70 and 59, and you have had normal Pap tests going back 10 years, you no longer need Pap tests. If you have had past treatment for cervical cancer or a condition that could lead to cancer, you need Pap tests and screening for cancer for at least 20 years after your treatment. If Pap tests have been discontinued, risk factors (such as a new sexual partner) need to be reassessed to determine if screening should be resumed. Some women have medical problems that increase the chance of getting cervical cancer. In these cases, your caregiver may recommend more frequent screening and Pap tests.  The human papillomavirus (HPV) test is an additional test that may be used for cervical cancer screening. The HPV test looks for the virus that can cause the cell changes on the cervix.  The cells collected during the Pap test can be tested for HPV. The HPV test could be used to screen women aged 46 years and older, and should be used in women of any age who have unclear Pap test results. After the age of 15, women should have HPV testing at the same frequency as a Pap test.  Colorectal cancer can be detected and often prevented. Most routine colorectal cancer screening begins at the age of 32 and continues through age 21. However, your caregiver may recommend screening at an earlier age if you have risk factors for colon cancer. On a yearly basis, your caregiver may provide home test kits to check for hidden blood in the stool. Use of a small camera at the end of a tube, to directly examine the colon (sigmoidoscopy or colonoscopy), can detect the earliest forms of colorectal cancer. Talk to your caregiver about this at age 71, when routine screening begins. Direct examination of the colon should be repeated every 5 to 10 years through  age 50, unless early forms of pre-cancerous polyps or small growths are found.  Hepatitis C blood testing is recommended for all people born from [birth year] through 1966 and any individual with known risks for hepatitis C.  Practice safe sex. Use condoms and avoid high-risk sexual practices to reduce the spread of sexually transmitted infections (STIs). Sexually active women aged 25 and younger should be checked for Chlamydia, which is a common sexually transmitted infection. Older women with new or multiple partners should also be tested for Chlamydia. Testing for other STIs is recommended if you are sexually active and at increased risk.  Osteoporosis is a disease in which the bones lose minerals and strength with aging. This can result in serious bone fractures. The risk of osteoporosis can be identified using a bone density scan. Women ages 77 and over and women at risk for fractures or osteoporosis should discuss screening with their caregivers. Ask your caregiver whether you should be taking a calcium supplement or vitamin D to reduce the rate of osteoporosis.  Menopause can be associated with physical symptoms and risks. Hormone replacement therapy is available to decrease symptoms and risks. You should talk to your caregiver about whether hormone replacement therapy is right for you.  Use sunscreen. Apply sunscreen liberally and repeatedly throughout the day. You should seek shade when your shadow is shorter than you. Protect yourself by wearing long sleeves, pants, a wide-brimmed hat, and sunglasses year round, whenever you are outdoors.  Notify your caregiver of new moles or changes in moles, especially if there is a change in shape or color. Also notify your caregiver if a mole is larger than the size of a pencil eraser.  Stay current with your immunizations. Document Released: 08/31/2010 Document Revised: 06/12/2012 Document Reviewed: 08/31/2010 [facility] Patient Information 2014 [place].

## 2013-09-25 NOTE — Progress Notes (Signed)
Cheyenne Holmes Sep 23, 1970 [id number]        43 y.o.  G0P0 for annual exam.  Doing well. Several issues noted below.  Past medical history,surgical history, problem list, medications, allergies, family history and social history were all reviewed and documented as reviewed in the EPIC chart.  ROS:  12 system ROS performed with pertinent positives and negatives included in the history, assessment and plan.   Additional significant findings :  None   Exam: Kim Counsellor Vitals:   09/25/13 0845  BP: 120/74  Height: 5\' 8"  (1.727 m)  Weight: 163 lb (73.936 kg)   General appearance:  Normal affect, orientation and appearance. Skin: Grossly normal HEENT: Without gross lesions.  No cervical or supraclavicular adenopathy. Thyroid normal.  Lungs:  Clear without wheezing, rales or rhonchi Cardiac: RR, without RMG Abdominal:  Soft, nontender, without masses, guarding, rebound, organomegaly or hernia Breasts:  Examined lying and sitting without masses, retractions, discharge or axillary adenopathy. Pelvic:  Ext/BUS/vagina normal  Cervix normal. Pap/HPV  Uterus anteverted, normal size, shape and contour, midline and mobile nontender   Adnexa  Without masses or tenderness    Anus and perineum  Normal   Rectovaginal  Normal sphincter tone without palpated masses or tenderness.    Assessment/Plan:  43 y.o. G0P0 female for annual exam with regular menses, oral contraceptives.   1. Contraceptive management. Patient doing well on oral contraceptives and wants to continue. We have discussed the risks multiple times to include stroke heart attack DVT. She does not smoke and is not being followed for a medical issues. I refilled her x1 year. 2. Pap smear 2012. Pap/HPV today.  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 Annual cytologies have all been normal since then. Repeat Pap at 3-5 year interval assuming this Pap smear is normal. 3. Mammography 04/11/2013. I reminded her  to schedule this. SBE monthly reviewed. 4. Fever blisters several times per year but today after some exposure. Had used acyclovir ointment for this. Valtrex 2 g by mouth at earliest onset of symptoms twice a day x1 day 1 g tabs #12 provided with 1 refill. Call if they continue. 5. Health maintenance. Had normal lipid profile glucose comprehensive metabolic panel CBC last year. Nothing has changed as far as weight or health status. Did not do labs this year. Patient's comfortable with this. Followup in one year, sooner as needed.   Note: This document was prepared with digital dictation and possible smart phrase technology. Any transcriptional errors that result from this process are unintentional.   Anastasio Auerbach MD, 9:23 AM 09/25/2013

## 2013-09-25 NOTE — Addendum Note (Signed)
Addended by: Nelva Nay on: 09/25/2013 09:33 AM   Modules accepted: Orders

## 2013-09-25 NOTE — Telephone Encounter (Signed)
Pharmacy called to claify directions for Valtrex 2 g by mouth at earliest onset of symptoms twice a day x1 day 1 g tabs #12 provided with 1 refill. This was done

## 2013-09-26 LAB — URINALYSIS W MICROSCOPIC + REFLEX CULTURE
BILIRUBIN URINE: NEGATIVE
Casts: NONE SEEN
Crystals: NONE SEEN
GLUCOSE, UA: NEGATIVE mg/dL
HGB URINE DIPSTICK: NEGATIVE
KETONES UR: NEGATIVE mg/dL
Leukocytes, UA: NEGATIVE
Nitrite: NEGATIVE
PROTEIN: NEGATIVE mg/dL
Specific Gravity, Urine: 1.013 (ref 1.005–1.030)
UROBILINOGEN UA: 0.2 mg/dL (ref 0.0–1.0)
pH: 5.5 (ref 5.0–8.0)

## 2013-09-26 LAB — CYTOLOGY - PAP

## 2014-01-09 ENCOUNTER — Other Ambulatory Visit: Payer: Self-pay

## 2014-01-09 DIAGNOSIS — Z1231 Encounter for screening mammogram for malignant neoplasm of breast: Secondary | ICD-10-CM

## 2014-01-30 ENCOUNTER — Other Ambulatory Visit: Payer: Self-pay

## 2014-01-30 ENCOUNTER — Ambulatory Visit
Admission: RE | Admit: 2014-01-30 | Discharge: 2014-01-30 | Disposition: A | Discharge status: Home / Self Care | Payer: 59 | Source: Ambulatory Visit

## 2014-01-30 DIAGNOSIS — Z1231 Encounter for screening mammogram for malignant neoplasm of breast: Secondary | ICD-10-CM

## 2014-09-04 ENCOUNTER — Other Ambulatory Visit: Payer: Self-pay | Admitting: Gynecology

## 2014-10-21 ENCOUNTER — Other Ambulatory Visit: Payer: Self-pay | Admitting: Gynecology

## 2014-10-30 ENCOUNTER — Ambulatory Visit (INDEPENDENT_AMBULATORY_CARE_PROVIDER_SITE_OTHER): Payer: 59 | Admitting: Gynecology

## 2014-10-30 ENCOUNTER — Encounter: Payer: Self-pay | Admitting: Gynecology

## 2014-10-30 VITALS — BP 124/78 | Ht 68.0 in | Wt 172.0 lb

## 2014-10-30 DIAGNOSIS — Z01419 Encounter for gynecological examination (general) (routine) without abnormal findings: Secondary | ICD-10-CM

## 2014-10-30 LAB — LIPID PANEL
CHOL/HDL RATIO: 2.6 ratio (ref <=5.0)
CHOLESTEROL: 137 mg/dL (ref 125–200)
HDL: 53 mg/dL (ref >=46)
LDL Cholesterol: 68 mg/dL (ref <130)
TRIGLYCERIDES: 82 mg/dL (ref <150)
VLDL: 16 mg/dL (ref <30)

## 2014-10-30 LAB — COMPREHENSIVE METABOLIC PANEL
ALBUMIN: 4.4 g/dL (ref 3.6–5.1)
ALT: 27 U/L (ref 6–29)
AST: 24 U/L (ref 10–30)
Alkaline Phosphatase: 62 U/L (ref 33–115)
BUN: 13 mg/dL (ref 7–25)
CALCIUM: 9.4 mg/dL (ref 8.6–10.2)
CHLORIDE: 103 mmol/L (ref 98–110)
CO2: 29 mmol/L (ref 20–31)
Creat: 0.69 mg/dL (ref 0.50–1.10)
Glucose, Bld: 98 mg/dL (ref 65–99)
POTASSIUM: 4.2 mmol/L (ref 3.5–5.3)
Sodium: 138 mmol/L (ref 135–146)
TOTAL PROTEIN: 6.6 g/dL (ref 6.1–8.1)
Total Bilirubin: 0.3 mg/dL (ref 0.2–1.2)

## 2014-10-30 LAB — CBC WITH DIFFERENTIAL/PLATELET
Basophils Absolute: 0.1 10*3/uL (ref 0.0–0.1)
Basophils Relative: 1 % (ref 0–1)
EOS ABS: 0.3 10*3/uL (ref 0.0–0.7)
Eosinophils Relative: 4 % (ref 0–5)
HCT: 39.2 % (ref 36.0–46.0)
Hemoglobin: 13.4 g/dL (ref 12.0–15.0)
LYMPHS ABS: 2.9 10*3/uL (ref 0.7–4.0)
Lymphocytes Relative: 37 % (ref 12–46)
MCH: 31.4 pg (ref 26.0–34.0)
MCHC: 34.2 g/dL (ref 30.0–36.0)
MCV: 91.8 fL (ref 78.0–100.0)
MONOS PCT: 7 % (ref 3–12)
MPV: 10.4 fL (ref 8.6–12.4)
Monocytes Absolute: 0.6 10*3/uL (ref 0.1–1.0)
NEUTROS PCT: 51 % (ref 43–77)
Neutro Abs: 4 10*3/uL (ref 1.7–7.7)
PLATELETS: 296 10*3/uL (ref 150–400)
RBC: 4.27 MIL/uL (ref 3.87–5.11)
RDW: 13.3 % (ref 11.5–15.5)
WBC: 7.9 10*3/uL (ref 4.0–10.5)

## 2014-10-30 MED ORDER — VALACYCLOVIR HCL 1 G PO TABS: ORAL_TABLET | ORAL | Status: DC

## 2014-10-30 MED ORDER — NORETHIN ACE-ETH ESTRAD-FE 1-20 MG-MCG PO TABS: 1.0000 | ORAL_TABLET | Freq: Every day | ORAL | Status: DC

## 2014-10-30 NOTE — Progress Notes (Signed)
Cheyenne Holmes 1971-01-18 [id number]        44 y.o.  G0P0 for annual exam.  Doing well without complaints.  Past medical history,surgical history, problem list, medications, allergies, family history and social history were all reviewed and documented as reviewed in the EPIC chart.  ROS:  Performed with pertinent positives and negatives included in the history, assessment and plan.   Additional significant findings :  none   Exam: Kim Counsellor Vitals:   10/30/14 1601  BP: 124/78  Height: 5\' 8"  (1.727 m)  Weight: 172 lb (78.019 kg)   General appearance:  Normal affect, orientation and appearance. Skin: Grossly normal HEENT: Without gross lesions.  No cervical or supraclavicular adenopathy. Thyroid normal.  Lungs:  Clear without wheezing, rales or rhonchi Cardiac: RR, without RMG Abdominal:  Soft, nontender, without masses, guarding, rebound, organomegaly or hernia Breasts:  Examined lying and sitting without masses, retractions, discharge or axillary adenopathy. Pelvic:  Ext/BUS/vagina normal  Cervix normal  Uterus anteverted, normal size, shape and contour, midline and mobile nontender   Adnexa  Without masses or tenderness    Anus and perineum  Normal   Rectovaginal  Normal sphincter tone without palpated masses or tenderness.    Assessment/Plan:  44 y.o. G0P0 female for annual exam 3 her menses, oral contraceptives.   1. Oral contraceptives. Patient continues on Loestrin 120 equivalents doing well. Wants to continue. Not being followed for any medical issue. Never smoker. Actively exercises. Accepts possible increased risk of thrombosis associated with the pills. Refill 1 year provided. 2. Mammography 01/2014. Continue with annual mammography. SBE monthly reviewed. 3. Pap smear/HPV negative 2015.  No Pap smear done today. History of LEEP 2 in 1999 for CIN-3 and 2001 procedure and 2. Pap smear is otherwise normal. Plan repeat at 3-5 year interval. 4. Herpes labialis.  Valtrex 2 g by mouth twice a day 1 day at earliest onset. #12 with one refill provided 5. Health maintenance. Baseline CBC comprehensive metabolic panel lipid profile urinalysis ordered. Follow up in one year, sooner as needed.   Anastasio Auerbach MD, 4:30 PM 10/30/2014

## 2014-10-30 NOTE — Patient Instructions (Signed)

## 2014-10-31 LAB — URINALYSIS W MICROSCOPIC + REFLEX CULTURE
Bilirubin Urine: NEGATIVE
CASTS: NONE SEEN [LPF]
CRYSTALS: NONE SEEN [HPF]
Glucose, UA: NEGATIVE
Hgb urine dipstick: NEGATIVE
KETONES UR: NEGATIVE
Leukocytes, UA: NEGATIVE
Nitrite: NEGATIVE
Protein, ur: NEGATIVE
RBC / HPF: NONE SEEN RBC/HPF (ref <=2)
SPECIFIC GRAVITY, URINE: 1.007 (ref 1.001–1.035)
YEAST: NONE SEEN [HPF]
pH: 7 (ref 5.0–8.0)

## 2014-11-01 ENCOUNTER — Other Ambulatory Visit: Payer: Self-pay | Admitting: Gynecology

## 2014-11-01 MED ORDER — SULFAMETHOXAZOLE-TRIMETHOPRIM 800-160 MG PO TABS: 1.0000 | ORAL_TABLET | Freq: Two times a day (BID) | ORAL | Status: DC

## 2014-11-02 LAB — URINE CULTURE

## 2015-10-31 ENCOUNTER — Ambulatory Visit (INDEPENDENT_AMBULATORY_CARE_PROVIDER_SITE_OTHER): Payer: BLUE CROSS/BLUE SHIELD | Admitting: Gynecology

## 2015-10-31 ENCOUNTER — Encounter: Payer: Self-pay | Admitting: Gynecology

## 2015-10-31 VITALS — BP 124/80 | Ht 68.0 in | Wt 170.0 lb

## 2015-10-31 DIAGNOSIS — Z01419 Encounter for gynecological examination (general) (routine) without abnormal findings: Secondary | ICD-10-CM | POA: Diagnosis not present

## 2015-10-31 LAB — CBC WITH DIFFERENTIAL/PLATELET
BASOS ABS: 0 {cells}/uL (ref 0–200)
Basophils Relative: 0 %
EOS PCT: 1 %
Eosinophils Absolute: 102 cells/uL (ref 15–500)
HCT: 42.8 % (ref 35.0–45.0)
Hemoglobin: 14.3 g/dL (ref 11.7–15.5)
LYMPHS PCT: 26 %
Lymphs Abs: 2652 cells/uL (ref 850–3900)
MCH: 30.9 pg (ref 27.0–33.0)
MCHC: 33.4 g/dL (ref 32.0–36.0)
MCV: 92.4 fL (ref 80.0–100.0)
MPV: 11.3 fL (ref 7.5–12.5)
Monocytes Absolute: 714 cells/uL (ref 200–950)
Monocytes Relative: 7 %
NEUTROS PCT: 66 %
Neutro Abs: 6732 cells/uL (ref 1500–7800)
Platelets: 301 10*3/uL (ref 140–400)
RBC: 4.63 MIL/uL (ref 3.80–5.10)
RDW: 13.5 % (ref 11.0–15.0)
WBC: 10.2 10*3/uL (ref 3.8–10.8)

## 2015-10-31 LAB — COMPREHENSIVE METABOLIC PANEL
ALT: 23 U/L (ref 6–29)
AST: 24 U/L (ref 10–35)
Albumin: 4.3 g/dL (ref 3.6–5.1)
Alkaline Phosphatase: 56 U/L (ref 33–115)
BILIRUBIN TOTAL: 0.6 mg/dL (ref 0.2–1.2)
BUN: 14 mg/dL (ref 7–25)
CALCIUM: 9.2 mg/dL (ref 8.6–10.2)
CHLORIDE: 103 mmol/L (ref 98–110)
CO2: 24 mmol/L (ref 20–31)
Creat: 0.89 mg/dL (ref 0.50–1.10)
GLUCOSE: 84 mg/dL (ref 65–99)
Potassium: 4.1 mmol/L (ref 3.5–5.3)
SODIUM: 138 mmol/L (ref 135–146)
Total Protein: 7 g/dL (ref 6.1–8.1)

## 2015-10-31 MED ORDER — NORETHIN ACE-ETH ESTRAD-FE 1-20 MG-MCG PO TABS: 1.0000 | ORAL_TABLET | Freq: Every day | ORAL | 12 refills | Status: DC

## 2015-10-31 MED ORDER — VALACYCLOVIR HCL 1 G PO TABS: ORAL_TABLET | ORAL | 1 refills | Status: DC

## 2015-10-31 NOTE — Patient Instructions (Signed)

## 2015-10-31 NOTE — Progress Notes (Signed)
    Cheyenne Holmes [birth year] [id number]        45 y.o.  G0P0  for annual exam.  Doing well without complaints  Past medical history,surgical history, problem list, medications, allergies, family history and social history were all reviewed and documented as reviewed in the EPIC chart.  ROS:  Performed with pertinent negatives and negatives included in the history, assessment and plan.   Additional significant findings :  None   Exam: Caryn Bee assistant Vitals:   10/31/15 0949  BP: 124/80  Weight: 170 lb (77.1 kg)  Height: 5\' 8"  (1.727 m)   Body mass index is 25.85 kg/m.  General appearance:  Normal affect, orientation and appearance. Skin: Grossly normal HEENT: Without gross lesions.  No cervical or supraclavicular adenopathy. Thyroid normal.  Lungs:  Clear without wheezing, rales or rhonchi Cardiac: RR, without RMG Abdominal:  Soft, nontender, without masses, guarding, rebound, organomegaly or hernia Breasts:  Examined lying and sitting without masses, retractions, discharge or axillary adenopathy. Pelvic:  Ext/BUS/Vagina normal  Cervix normal  Uterus anteverted, normal size, shape and contour, midline and mobile nontender   Adnexa without masses or tenderness    Anus and perineum normal   Rectovaginal normal sphincter tone without palpated masses or tenderness.    Assessment/Plan:  45 y.o. G0P0 female for annual exam with regular menses, oral contraceptives.   1. Oral contraceptives. Continues on Loestrin 1/20 equivalents. Once to continue. Never smoked and not being followed for any medical issues. Slight increased risk of thrombosis reviewed and accepted. Refill 1 year provided. 2. Mammography overdo. I reminded patient to schedule and she agrees to do so. SBE monthly reviewed. 3. Pap smear/HPV 08/2013 negative. No Pap smear done today. 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 Normal Pap smears afterwards. 4. History of herpes labialis. Uses Valtrex 2 g at earliest  onset. #12 with one refill provided. 5. Health maintenance. Baseline CBC, CMP and urinalysis ordered. Lipid profile last year great. SBE monthly reviewed. Follow up in one year, sooner as needed.   Anastasio Auerbach MD, 10:14 AM 10/31/2015

## 2015-11-01 LAB — URINALYSIS W MICROSCOPIC + REFLEX CULTURE
BILIRUBIN URINE: NEGATIVE
CASTS: NONE SEEN [LPF]
Crystals: NONE SEEN [HPF]
Glucose, UA: NEGATIVE
Hgb urine dipstick: NEGATIVE
KETONES UR: NEGATIVE
Leukocytes, UA: NEGATIVE
NITRITE: NEGATIVE
PH: 6.5 (ref 5.0–8.0)
Protein, ur: NEGATIVE
RBC / HPF: NONE SEEN RBC/HPF (ref <=2)
Specific Gravity, Urine: 1.012 (ref 1.001–1.035)
YEAST: NONE SEEN [HPF]

## 2015-11-02 LAB — URINE CULTURE: ORGANISM ID, BACTERIA: NO GROWTH

## 2015-12-01 ENCOUNTER — Other Ambulatory Visit: Payer: Self-pay | Admitting: Gynecology

## 2015-12-01 DIAGNOSIS — Z1231 Encounter for screening mammogram for malignant neoplasm of breast: Secondary | ICD-10-CM

## 2015-12-12 ENCOUNTER — Ambulatory Visit
Admission: RE | Admit: 2015-12-12 | Discharge: 2015-12-12 | Disposition: A | Discharge status: Home / Self Care | Payer: BLUE CROSS/BLUE SHIELD | Source: Ambulatory Visit | Attending: Gynecology | Admitting: Gynecology

## 2015-12-12 DIAGNOSIS — Z1231 Encounter for screening mammogram for malignant neoplasm of breast: Secondary | ICD-10-CM

## 2016-11-03 ENCOUNTER — Encounter: Payer: BLUE CROSS/BLUE SHIELD | Admitting: Gynecology

## 2016-11-08 ENCOUNTER — Other Ambulatory Visit: Payer: Self-pay | Admitting: Gynecology

## 2016-11-08 DIAGNOSIS — Z1231 Encounter for screening mammogram for malignant neoplasm of breast: Secondary | ICD-10-CM

## 2016-11-19 ENCOUNTER — Other Ambulatory Visit: Payer: Self-pay | Admitting: Gynecology

## 2016-11-19 NOTE — Telephone Encounter (Signed)
Annual on 12/03/16

## 2016-12-03 ENCOUNTER — Ambulatory Visit (INDEPENDENT_AMBULATORY_CARE_PROVIDER_SITE_OTHER): Payer: BLUE CROSS/BLUE SHIELD | Admitting: Gynecology

## 2016-12-03 ENCOUNTER — Encounter: Payer: Self-pay | Admitting: Gynecology

## 2016-12-03 VITALS — BP 118/76 | Ht 68.0 in | Wt 171.0 lb

## 2016-12-03 DIAGNOSIS — Z13220 Encounter for screening for lipoid disorders: Secondary | ICD-10-CM

## 2016-12-03 DIAGNOSIS — Z01419 Encounter for gynecological examination (general) (routine) without abnormal findings: Secondary | ICD-10-CM

## 2016-12-03 LAB — COMPREHENSIVE METABOLIC PANEL
AG RATIO: 1.7 (calc) (ref 1.0–2.5)
ALKALINE PHOSPHATASE (APISO): 54 U/L (ref 33–115)
ALT: 20 U/L (ref 6–29)
AST: 16 U/L (ref 10–35)
Albumin: 4.3 g/dL (ref 3.6–5.1)
BUN/Creatinine Ratio: 15 (calc) (ref 6–22)
BUN: 19 mg/dL (ref 7–25)
CHLORIDE: 103 mmol/L (ref 98–110)
CO2: 28 mmol/L (ref 20–32)
Calcium: 10.2 mg/dL (ref 8.6–10.2)
Creat: 1.26 mg/dL — ABNORMAL HIGH (ref 0.50–1.10)
GLOBULIN: 2.6 g/dL (ref 1.9–3.7)
Glucose, Bld: 95 mg/dL (ref 65–99)
Potassium: 5.3 mmol/L (ref 3.5–5.3)
Sodium: 140 mmol/L (ref 135–146)
Total Bilirubin: 0.4 mg/dL (ref 0.2–1.2)
Total Protein: 6.9 g/dL (ref 6.1–8.1)

## 2016-12-03 LAB — CBC WITH DIFFERENTIAL/PLATELET
BASOS ABS: 64 {cells}/uL (ref 0–200)
BASOS PCT: 0.8 %
Eosinophils Absolute: 104 cells/uL (ref 15–500)
Eosinophils Relative: 1.3 %
HCT: 38.8 % (ref 35.0–45.0)
HEMOGLOBIN: 13.3 g/dL (ref 11.7–15.5)
Lymphs Abs: 2464 cells/uL (ref 850–3900)
MCH: 30.8 pg (ref 27.0–33.0)
MCHC: 34.3 g/dL (ref 32.0–36.0)
MCV: 89.8 fL (ref 80.0–100.0)
MONOS PCT: 6.4 %
MPV: 11.4 fL (ref 7.5–12.5)
NEUTROS ABS: 4856 {cells}/uL (ref 1500–7800)
Neutrophils Relative %: 60.7 %
Platelets: 321 10*3/uL (ref 140–400)
RBC: 4.32 10*6/uL (ref 3.80–5.10)
RDW: 12.4 % (ref 11.0–15.0)
Total Lymphocyte: 30.8 %
WBC: 8 10*3/uL (ref 3.8–10.8)
WBCMIX: 512 {cells}/uL (ref 200–950)

## 2016-12-03 LAB — LIPID PANEL
CHOLESTEROL: 163 mg/dL (ref <200)
HDL: 52 mg/dL (ref >50)
LDL Cholesterol (Calc): 91 mg/dL (calc)
Non-HDL Cholesterol (Calc): 111 mg/dL (calc) (ref <130)
TRIGLYCERIDES: 108 mg/dL (ref <150)
Total CHOL/HDL Ratio: 3.1 (calc) (ref <5.0)

## 2016-12-03 MED ORDER — NORETHIN ACE-ETH ESTRAD-FE 1-20 MG-MCG PO TABS: 1.0000 | ORAL_TABLET | Freq: Every day | ORAL | 4 refills | Status: DC

## 2016-12-03 NOTE — Addendum Note (Signed)
Addended by: Nelva Nay on: 12/03/2016 03:09 PM   Modules accepted: Orders

## 2016-12-03 NOTE — Progress Notes (Signed)
    Cheyenne Holmes 06-05-1970 [id number]        46 y.o.  G0P0 for annual gynecologic exam.  Doing well without complaints  Past medical history,surgical history, problem list, medications, allergies, family history and social history were all reviewed and documented as reviewed in the EPIC chart.  ROS:  Performed with pertinent positives and negatives included in the history, assessment and plan.   Additional significant findings :  None   Exam: Caryn Bee assistant Vitals:   12/03/16 1438  BP: 118/76  Weight: 171 lb (77.6 kg)  Height: 5\' 8"  (1.727 m)   Body mass index is 26 kg/m.  General appearance:  Normal affect, orientation and appearance. Skin: Grossly normal HEENT: Without gross lesions.  No cervical or supraclavicular adenopathy. Thyroid normal.  Lungs:  Clear without wheezing, rales or rhonchi Cardiac: RR, without RMG Abdominal:  Soft, nontender, without masses, guarding, rebound, organomegaly or hernia Breasts:  Examined lying and sitting without masses, retractions, discharge or axillary adenopathy. Pelvic:  Ext, BUS, Vagina: Normal  Cervix: Normal. Pap smear done  Uterus: Anteverted, normal size, shape and contour, midline and mobile nontender   Adnexa: Without masses or tenderness    Anus and perineum: Normal   Rectovaginal: Normal sphincter tone without palpated masses or tenderness.    Assessment/Plan:  46 y.o. G0P0 female for annual gynecologic exam with regular menses, oral contraceptives.   1. Loestrin 1/20 equivalent. Doing well and wants to continue. Risks of thrombosis reviewed.  Refill 1 year provided. 2. Mammography due now and patient knows to follow up for this. Breast exam normal today. 3. Pap smear/HPV 2015. Pap smear done today. 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 Normal Pap smears since. 4. History of herpes labialis. Uses Valtrex 2 g at earliest onset. Has supply but will call when needs more. 5. Health maintenance. Baseline CBC, CMP,  lipid profile ordered. Follow up 1 year, sooner as needed.   Anastasio Auerbach MD, 3:01 PM 12/03/2016

## 2016-12-03 NOTE — Patient Instructions (Signed)
Annual exam in one year 

## 2016-12-06 ENCOUNTER — Other Ambulatory Visit: Payer: Self-pay | Admitting: Gynecology

## 2016-12-06 DIAGNOSIS — R7989 Other specified abnormal findings of blood chemistry: Secondary | ICD-10-CM

## 2016-12-06 LAB — PAP IG W/ RFLX HPV ASCU

## 2016-12-14 ENCOUNTER — Ambulatory Visit
Admission: RE | Admit: 2016-12-14 | Discharge: 2016-12-14 | Disposition: A | Discharge status: Home / Self Care | Payer: BLUE CROSS/BLUE SHIELD | Source: Ambulatory Visit | Attending: Gynecology | Admitting: Gynecology

## 2016-12-14 DIAGNOSIS — Z1231 Encounter for screening mammogram for malignant neoplasm of breast: Secondary | ICD-10-CM

## 2017-03-15 ENCOUNTER — Other Ambulatory Visit: Payer: BLUE CROSS/BLUE SHIELD

## 2017-03-15 DIAGNOSIS — R7989 Other specified abnormal findings of blood chemistry: Secondary | ICD-10-CM

## 2017-03-15 LAB — BUN: BUN: 16 mg/dL (ref 7–25)

## 2017-03-15 LAB — CREATININE, SERUM: CREATININE: 0.87 mg/dL (ref 0.50–1.10)

## 2017-03-16 ENCOUNTER — Encounter: Payer: Self-pay | Admitting: Gynecology

## 2017-11-14 ENCOUNTER — Other Ambulatory Visit: Payer: Self-pay | Admitting: Gynecology

## 2017-11-14 DIAGNOSIS — Z1231 Encounter for screening mammogram for malignant neoplasm of breast: Secondary | ICD-10-CM

## 2017-12-06 ENCOUNTER — Encounter: Payer: BLUE CROSS/BLUE SHIELD | Admitting: Gynecology

## 2017-12-13 ENCOUNTER — Telehealth: Payer: Self-pay | Admitting: *Deleted

## 2017-12-13 ENCOUNTER — Ambulatory Visit (INDEPENDENT_AMBULATORY_CARE_PROVIDER_SITE_OTHER): Payer: BLUE CROSS/BLUE SHIELD | Admitting: Gynecology

## 2017-12-13 ENCOUNTER — Encounter: Payer: Self-pay | Admitting: *Deleted

## 2017-12-13 ENCOUNTER — Encounter: Payer: Self-pay | Admitting: Gynecology

## 2017-12-13 VITALS — BP 116/72 | Ht 68.0 in | Wt 175.0 lb

## 2017-12-13 DIAGNOSIS — B001 Herpesviral vesicular dermatitis: Secondary | ICD-10-CM

## 2017-12-13 DIAGNOSIS — Z13220 Encounter for screening for lipoid disorders: Secondary | ICD-10-CM

## 2017-12-13 DIAGNOSIS — R21 Rash and other nonspecific skin eruption: Secondary | ICD-10-CM

## 2017-12-13 DIAGNOSIS — Z01419 Encounter for gynecological examination (general) (routine) without abnormal findings: Secondary | ICD-10-CM

## 2017-12-13 LAB — COMPREHENSIVE METABOLIC PANEL
AG RATIO: 1.7 (calc) (ref 1.0–2.5)
ALBUMIN MSPROF: 4.4 g/dL (ref 3.6–5.1)
ALT: 19 U/L (ref 6–29)
AST: 17 U/L (ref 10–35)
Alkaline phosphatase (APISO): 51 U/L (ref 33–115)
BUN: 17 mg/dL (ref 7–25)
CALCIUM: 9.7 mg/dL (ref 8.6–10.2)
CO2: 25 mmol/L (ref 20–32)
Chloride: 103 mmol/L (ref 98–110)
Creat: 0.8 mg/dL (ref 0.50–1.10)
Globulin: 2.6 g/dL (calc) (ref 1.9–3.7)
Glucose, Bld: 77 mg/dL (ref 65–99)
POTASSIUM: 4.2 mmol/L (ref 3.5–5.3)
SODIUM: 139 mmol/L (ref 135–146)
TOTAL PROTEIN: 7 g/dL (ref 6.1–8.1)
Total Bilirubin: 0.4 mg/dL (ref 0.2–1.2)

## 2017-12-13 LAB — CBC WITH DIFFERENTIAL/PLATELET
BASOS ABS: 62 {cells}/uL (ref 0–200)
Basophils Relative: 0.7 %
EOS PCT: 2.5 %
Eosinophils Absolute: 223 cells/uL (ref 15–500)
HEMATOCRIT: 42.4 % (ref 35.0–45.0)
Hemoglobin: 14.5 g/dL (ref 11.7–15.5)
LYMPHS ABS: 2599 {cells}/uL (ref 850–3900)
MCH: 30.7 pg (ref 27.0–33.0)
MCHC: 34.2 g/dL (ref 32.0–36.0)
MCV: 89.8 fL (ref 80.0–100.0)
MPV: 11.2 fL (ref 7.5–12.5)
Monocytes Relative: 7.1 %
NEUTROS PCT: 60.5 %
Neutro Abs: 5385 cells/uL (ref 1500–7800)
Platelets: 297 10*3/uL (ref 140–400)
RBC: 4.72 10*6/uL (ref 3.80–5.10)
RDW: 12.1 % (ref 11.0–15.0)
Total Lymphocyte: 29.2 %
WBC mixed population: 632 cells/uL (ref 200–950)
WBC: 8.9 10*3/uL (ref 3.8–10.8)

## 2017-12-13 LAB — LIPID PANEL
Cholesterol: 175 mg/dL (ref <200)
HDL: 59 mg/dL (ref >50)
LDL CHOLESTEROL (CALC): 98 mg/dL
NON-HDL CHOLESTEROL (CALC): 116 mg/dL (ref <130)
Total CHOL/HDL Ratio: 3 (calc) (ref <5.0)
Triglycerides: 90 mg/dL (ref <150)

## 2017-12-13 MED ORDER — VALACYCLOVIR HCL 1 G PO TABS: ORAL_TABLET | ORAL | 2 refills | Status: DC

## 2017-12-13 MED ORDER — NYSTATIN-TRIAMCINOLONE 100000-0.1 UNIT/GM-% EX OINT: 1.0000 "application " | TOPICAL_OINTMENT | Freq: Two times a day (BID) | CUTANEOUS | 0 refills | Status: AC

## 2017-12-13 MED ORDER — NORETHIN ACE-ETH ESTRAD-FE 1-20 MG-MCG PO TABS: 1.0000 | ORAL_TABLET | Freq: Every day | ORAL | 4 refills | Status: DC

## 2017-12-13 NOTE — Progress Notes (Addendum)
    Cheyenne Holmes 1970-10-31 [id number]        47 y.o.  G0P0 for annual gynecologic exam.  Notes skin rash between her breasts.  Past medical history,surgical history, problem list, medications, allergies, family history and social history were all reviewed and documented as reviewed in the EPIC chart.  ROS:  Performed with pertinent positives and negatives included in the history, assessment and plan.   Additional significant findings : None   Exam: Caryn Bee assistant Vitals:   12/13/17 0822  BP: 116/72  Weight: 175 lb (79.4 kg)  Height: 5\' 8"  (1.727 m)   Body mass index is 26.61 kg/m.  General appearance:  Normal affect, orientation and appearance. Skin: Grossly normal HEENT: Without gross lesions.  No cervical or supraclavicular adenopathy. Thyroid normal.  Lungs:  Clear without wheezing, rales or rhonchi Cardiac: RR, without RMG Abdominal:  Soft, nontender, without masses, guarding, rebound, organomegaly or hernia Breasts:  Examined lying and sitting without masses, retractions, discharge or axillary adenopathy.  Classic appearing yeast rash between breasts. Pelvic:  Ext, BUS, Vagina: Normal  Cervix: Normal  Uterus: Anteverted, normal size, shape and contour, midline and mobile nontender   Adnexa: Without masses or tenderness    Anus and perineum: Normal   Rectovaginal: Normal sphincter tone without palpated masses or tenderness.    Assessment/Plan:  47 y.o. G0P0 female for annual gynecologic exam with regular menses, oral contraceptives.   1. Loestrin 1/20.  Doing well and wants to continue.  Never smoked and not being followed for medical issues.  Risks of thrombosis again reviewed.  Refill x1 year provided. 2. Mammography scheduled in several weeks.  Breast exam normal today. 3. Pap smear 2018.  Pap smear/HPV 2015.  No Pap smear done today.  History of LEEP x2 in the past for HGSIL.  Normal Pap since.  Plan repeat Pap smear at 3-year interval per current screening  guidelines. 4. History of herpes labialis.  Uses Valtrex 2 g at earliest onset.  Refill provided. 5. Skin rash between breasts.  Classic in appearance for yeast.  Will treat with Mycolog topically twice daily as needed. 6. Health maintenance.  Baseline CBC, CMP and lipid profile ordered.  Follow-up 1 year, sooner as needed.   Anastasio Auerbach MD, 8:43 AM 12/13/2017

## 2017-12-13 NOTE — Addendum Note (Signed)
Addended by: Anastasio Auerbach on: 12/13/2017 12:52 PM   Modules accepted: Orders

## 2017-12-13 NOTE — Telephone Encounter (Signed)
-----   Message from Anastasio Auerbach, MD sent at 12/13/2017  8:43 AM EDT ----- Call her listed pharmacy in [place] and cancel the Valtrex prescription I put through.  She changed her mind and I sent it to the [facility] as she requested but needs the [facility] one canceled  Thanks

## 2017-12-13 NOTE — Patient Instructions (Signed)
Follow-up in 1 year for annual exam, sooner as needed. 

## 2017-12-13 NOTE — Telephone Encounter (Signed)
Left on [facility] [place] to cancel Rx for Valtrex.

## 2017-12-16 ENCOUNTER — Encounter: Payer: Self-pay | Admitting: Radiology

## 2017-12-16 ENCOUNTER — Ambulatory Visit
Admission: RE | Admit: 2017-12-16 | Discharge: 2017-12-16 | Disposition: A | Discharge status: Home / Self Care | Payer: BLUE CROSS/BLUE SHIELD | Source: Ambulatory Visit | Attending: Gynecology | Admitting: Gynecology

## 2017-12-16 DIAGNOSIS — Z1231 Encounter for screening mammogram for malignant neoplasm of breast: Secondary | ICD-10-CM

## 2018-11-21 ENCOUNTER — Encounter: Payer: Self-pay | Admitting: Gynecology

## 2018-12-15 ENCOUNTER — Other Ambulatory Visit: Payer: Self-pay

## 2018-12-18 ENCOUNTER — Encounter: Payer: Self-pay | Admitting: Gynecology

## 2018-12-18 ENCOUNTER — Ambulatory Visit (INDEPENDENT_AMBULATORY_CARE_PROVIDER_SITE_OTHER): Payer: Self-pay | Admitting: Gynecology

## 2018-12-18 VITALS — BP 122/78 | Ht 68.0 in | Wt 188.0 lb

## 2018-12-18 DIAGNOSIS — Z01419 Encounter for gynecological examination (general) (routine) without abnormal findings: Secondary | ICD-10-CM

## 2018-12-18 MED ORDER — NORETHIN ACE-ETH ESTRAD-FE 1-20 MG-MCG PO TABS: 1.0000 | ORAL_TABLET | Freq: Every day | ORAL | 4 refills | Status: DC

## 2018-12-18 MED ORDER — VALACYCLOVIR HCL 1 G PO TABS: ORAL_TABLET | ORAL | 2 refills | Status: DC

## 2018-12-18 NOTE — Patient Instructions (Signed)
Follow-up in 1 year for annual exam, sooner as needed. 

## 2018-12-18 NOTE — Progress Notes (Signed)
    Cheyenne Holmes 12-May-1970 [id number]        47 y.o.  G0P0 for annual gynecologic exam.  Without gynecologic complaints  Past medical history,surgical history, problem list, medications, allergies, family history and social history were all reviewed and documented as reviewed in the EPIC chart.  ROS:  Performed with pertinent positives and negatives included in the history, assessment and plan.   Additional significant findings : None   Exam: Caryn Bee assistant Vitals:   12/18/18 0802  BP: 122/78  Weight: 188 lb (85.3 kg)  Height: 5\' 8"  (1.727 m)   Body mass index is 28.59 kg/m.  General appearance:  Normal affect, orientation and appearance. Skin: Grossly normal HEENT: Without gross lesions.  No cervical or supraclavicular adenopathy. Thyroid normal.  Lungs:  Clear without wheezing, rales or rhonchi Cardiac: RR, without RMG Abdominal:  Soft, nontender, without masses, guarding, rebound, organomegaly or hernia Breasts:  Examined lying and sitting without masses, retractions, discharge or axillary adenopathy. Pelvic:  Ext, BUS, Vagina: Normal  Cervix: Normal  Uterus: Anteverted, normal size, shape and contour, midline and mobile nontender   Adnexa: Without masses or tenderness    Anus and perineum: Normal   Rectovaginal: Normal sphincter tone without palpated masses or tenderness.    Assessment/Plan:  48 y.o. G0P0 female for annual gynecologic exam.  With regular menses, oral contraceptives  1. Oral contraceptives.  Doing well on Loestrin 1/20 equivalents.  Would like to continue.  Not being followed for any medical issues.  Never smoked.  Accepts risks of thrombosis.  Refill x1 year provided. 2. History of herpes labialis.  Uses Valtrex 2 g at earliest onset.  Refill provided. 3. Mammography due now and I reminded her to schedule this.  Breast exam normal today. 4. Pap smear 11/2016.  No Pap smear done today.  Plan repeat Pap smear next year at 3-year interval per  current screening guidelines.  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 5. Health maintenance.  Recently lost insurance and prefers not to do lab work at this time.  We will follow-up for lab work after she gets her insurance.  Follow-up in 1 year, sooner as needed.   Anastasio Auerbach MD, 8:26 AM 12/18/2018

## 2019-12-18 ENCOUNTER — Other Ambulatory Visit: Payer: Self-pay

## 2019-12-18 ENCOUNTER — Encounter: Payer: Self-pay | Admitting: Obstetrics and Gynecology

## 2019-12-18 ENCOUNTER — Ambulatory Visit (INDEPENDENT_AMBULATORY_CARE_PROVIDER_SITE_OTHER): Payer: BLUE CROSS/BLUE SHIELD | Admitting: Obstetrics and Gynecology

## 2019-12-18 VITALS — BP 118/76 | Ht 68.0 in | Wt 199.0 lb

## 2019-12-18 DIAGNOSIS — Z13220 Encounter for screening for lipoid disorders: Secondary | ICD-10-CM | POA: Diagnosis not present

## 2019-12-18 DIAGNOSIS — Z01419 Encounter for gynecological examination (general) (routine) without abnormal findings: Secondary | ICD-10-CM | POA: Diagnosis not present

## 2019-12-18 LAB — CBC: Platelets: 290 10*3/uL (ref 140–400)

## 2019-12-18 MED ORDER — VALACYCLOVIR HCL 1 G PO TABS: ORAL_TABLET | ORAL | 2 refills | Status: AC

## 2019-12-18 MED ORDER — NORETHIN ACE-ETH ESTRAD-FE 1-20 MG-MCG PO TABS: 1.0000 | ORAL_TABLET | Freq: Every day | ORAL | 4 refills | Status: AC

## 2019-12-18 NOTE — Progress Notes (Signed)
   OLIVIAGRACE CRISANTI 12/12/1970 [id number]  SUBJECTIVE:  49 y.o. G0P0 female for annual routine gynecologic exam. She has no gynecologic concerns.  Current Outpatient Medications  Medication Sig Dispense Refill  . Cetirizine HCl (ZYRTEC PO) Take by mouth.    Marland Kitchen ibuprofen (ADVIL,MOTRIN) 800 MG tablet Take 800 mg by mouth every 8 (eight) hours as needed.      . norethindrone-ethinyl estradiol (JUNEL FE 1/20) 1-20 MG-MCG tablet Take 1 tablet by mouth daily. 3 Package 4  . nystatin-triamcinolone ointment (MYCOLOG) Apply 1 application topically 2 (two) times daily. 30 g 0  . valACYclovir (VALTREX) 1000 MG tablet 2 tabs (2 g), repeat at 12 hours at earliest onset of blisters 12 tablet 2   No current facility-administered medications for this visit.   Allergies: Patient has no known allergies.  Patient's last menstrual period was 12/10/2019.  Past medical history,surgical history, problem list, medications, allergies, family history and social history were all reviewed and documented as reviewed in the EPIC chart.  ROS: Pertinent positives and negatives as reviewed in HPI   OBJECTIVE:  BP 118/76   Ht 5\' 8"  (1.727 m)   Wt 199 lb (90.3 kg)   LMP 12/10/2019   BMI 30.26 kg/m  The patient appears well, alert, oriented, in no distress. ENT normal.  Neck supple. No cervical or supraclavicular adenopathy or thyromegaly.  Lungs are clear, good air entry, no wheezes, rhonchi or rales. S1 and S2 normal, no murmurs, regular rate and rhythm.  Abdomen soft without tenderness, guarding, mass or organomegaly.  Neurological is normal, no focal findings.  BREAST EXAM: breasts appear normal, no suspicious masses, no skin or nipple changes or axillary nodes  PELVIC EXAM: VULVA: normal appearing vulva with no masses, tenderness or lesions, VAGINA: normal appearing vagina with normal color and discharge, no lesions, CERVIX: normal appearing cervix without discharge or lesions, UTERUS: uterus is normal size,  shape, consistency and nontender, ADNEXA: normal adnexa in size, nontender and no masses  Chaperone: Caryn Bee present during the examination  ASSESSMENT:  49 y.o. G0P0 here for annual gynecologic exam  PLAN:   1. No hormonal or menstrual concerns.  Does well on Loestrin 1/20 equivalent.  Non-smoker and no other medical problems.  Normotensive.  Knows risks of thrombotic diseases with the use of OCPs and increasing risk with age.  Will consider modifying therapy if she starts to demonstrate perimenopausal symptoms.  Would like to continue.  Refill x1 year provided. 2. Pap smear 11/2017.  History LEEP x 2 in the past for HGSIL with normal Pap smears since.  Next Pap smear due in 1 year following the current guidelines recommending the 3 year interval. 3. Contraception.  Loestrin 1/20 equivalent as above. 4.  Mammogram 11/2017.  Breast exam normal.  Did not get one last year when she was in between health insurance.  Understands that she is overdue and will make plans to get screening soon. 5.  History of herpes labialis.  Uses Valtrex 2 g at first onset of symptoms.  Refill provided. 6. Health maintenance.  She will proceed to lab today for routine screening blood work (lipids, CBC, CMP).  Return annually or sooner, prn.  Joseph Pierini MD 12/18/19

## 2019-12-19 LAB — CBC
HCT: 45.3 % — ABNORMAL HIGH (ref 35.0–45.0)
Hemoglobin: 14.8 g/dL (ref 11.7–15.5)
MCH: 30 pg (ref 27.0–33.0)
MCHC: 32.7 g/dL (ref 32.0–36.0)
MCV: 91.9 fL (ref 80.0–100.0)
MPV: 12.3 fL (ref 7.5–12.5)
RBC: 4.93 10*6/uL (ref 3.80–5.10)
RDW: 12.7 % (ref 11.0–15.0)
WBC: 8.6 10*3/uL (ref 3.8–10.8)

## 2019-12-19 LAB — COMPREHENSIVE METABOLIC PANEL
AG Ratio: 1.6 (calc) (ref 1.0–2.5)
ALT: 47 U/L — ABNORMAL HIGH (ref 6–29)
AST: 42 U/L — ABNORMAL HIGH (ref 10–35)
Albumin: 4.4 g/dL (ref 3.6–5.1)
Alkaline phosphatase (APISO): 61 U/L (ref 31–125)
BUN/Creatinine Ratio: 15 (calc) (ref 6–22)
BUN: 17 mg/dL (ref 7–25)
CO2: 24 mmol/L (ref 20–32)
Calcium: 9.8 mg/dL (ref 8.6–10.2)
Chloride: 105 mmol/L (ref 98–110)
Creat: 1.16 mg/dL — ABNORMAL HIGH (ref 0.50–1.10)
Globulin: 2.7 g/dL (calc) (ref 1.9–3.7)
Glucose, Bld: 93 mg/dL (ref 65–99)
Potassium: 5 mmol/L (ref 3.5–5.3)
Sodium: 140 mmol/L (ref 135–146)
Total Bilirubin: 0.5 mg/dL (ref 0.2–1.2)
Total Protein: 7.1 g/dL (ref 6.1–8.1)

## 2019-12-19 LAB — LIPID PANEL
Cholesterol: 206 mg/dL — ABNORMAL HIGH (ref <200)
HDL: 55 mg/dL (ref >=50)
LDL Cholesterol (Calc): 120 mg/dL (calc) — ABNORMAL HIGH
Non-HDL Cholesterol (Calc): 151 mg/dL (calc) — ABNORMAL HIGH (ref <130)
Total CHOL/HDL Ratio: 3.7 (calc) (ref <5.0)
Triglycerides: 190 mg/dL — ABNORMAL HIGH (ref <150)

## 2019-12-20 ENCOUNTER — Encounter: Payer: Self-pay | Admitting: Obstetrics and Gynecology

## 2020-04-23 ENCOUNTER — Other Ambulatory Visit: Payer: Self-pay | Admitting: Optometry

## 2020-04-23 ENCOUNTER — Other Ambulatory Visit: Payer: Self-pay | Admitting: Obstetrics and Gynecology

## 2020-04-23 DIAGNOSIS — Z1231 Encounter for screening mammogram for malignant neoplasm of breast: Secondary | ICD-10-CM

## 2020-12-18 ENCOUNTER — Ambulatory Visit: Payer: BLUE CROSS/BLUE SHIELD

## 2020-12-18 ENCOUNTER — Ambulatory Visit: Payer: BLUE CROSS/BLUE SHIELD | Admitting: Obstetrics and Gynecology

## 2021-01-02 ENCOUNTER — Ambulatory Visit: Payer: BLUE CROSS/BLUE SHIELD | Admitting: Obstetrics & Gynecology

## 2021-01-06 ENCOUNTER — Ambulatory Visit: Payer: BLUE CROSS/BLUE SHIELD | Admitting: Nurse Practitioner

## 2021-01-06 ENCOUNTER — Other Ambulatory Visit: Payer: Self-pay

## 2021-01-06 ENCOUNTER — Ambulatory Visit
Admission: RE | Admit: 2021-01-06 | Discharge: 2021-01-06 | Disposition: A | Discharge status: Home / Self Care | Payer: BC Managed Care – PPO | Source: Ambulatory Visit | Attending: Obstetrics and Gynecology | Admitting: Obstetrics and Gynecology

## 2021-01-06 DIAGNOSIS — Z1231 Encounter for screening mammogram for malignant neoplasm of breast: Secondary | ICD-10-CM

## 2021-01-07 ENCOUNTER — Telehealth: Payer: Self-pay

## 2021-01-07 NOTE — Telephone Encounter (Signed)
I called patient because we received her mammo result and she is overdue for AEX.  She said she has changed providers and will be seeing Dr. Luci Bank at [facility]. Upon closer review of the mammo report I do see that this physician's name is on it so he will receive this result.

## 2021-11-24 ENCOUNTER — Other Ambulatory Visit: Payer: Self-pay | Admitting: Obstetrics and Gynecology

## 2021-11-24 DIAGNOSIS — Z1231 Encounter for screening mammogram for malignant neoplasm of breast: Secondary | ICD-10-CM

## 2022-01-08 ENCOUNTER — Ambulatory Visit: Payer: BC Managed Care – PPO

## 2022-03-04 IMAGING — MG MM DIGITAL SCREENING BILAT W/ TOMO AND CAD
8 series · 8 of 24 positions shown · non-contrast
Comparison: Previous exam(s).

CLINICAL DATA: Screening.

EXAM:
DIGITAL SCREENING BILATERAL MAMMOGRAM WITH TOMOSYNTHESIS AND CAD
TECHNIQUE: Bilateral screening digital craniocaudal and mediolateral oblique
mammograms were obtained. Bilateral screening digital breast
tomosynthesis was performed. The images were evaluated with
computer-aided detection.

[R CC synth-2D]
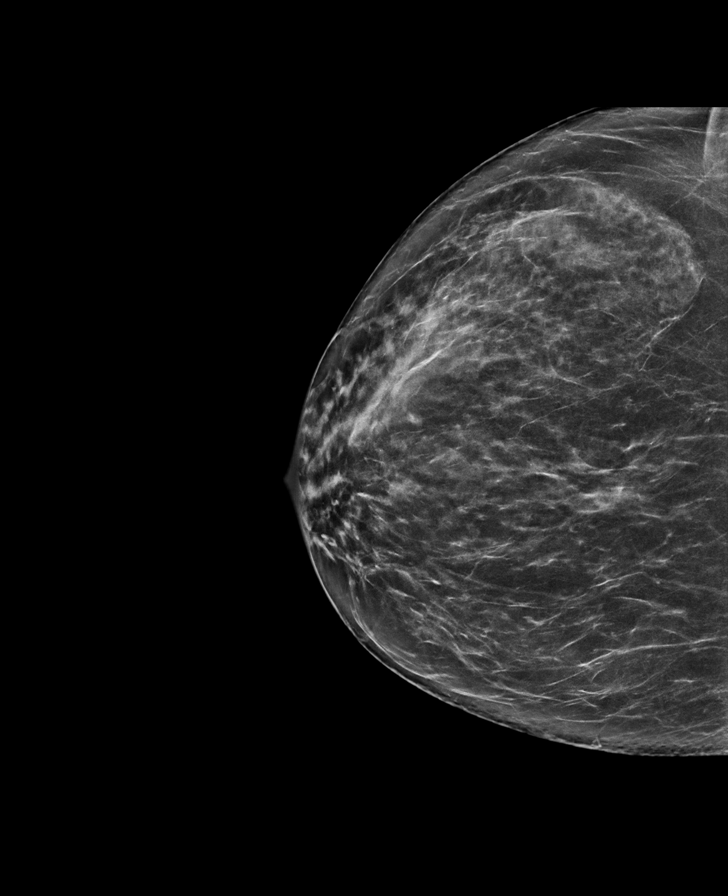

[L CC synth-2D]
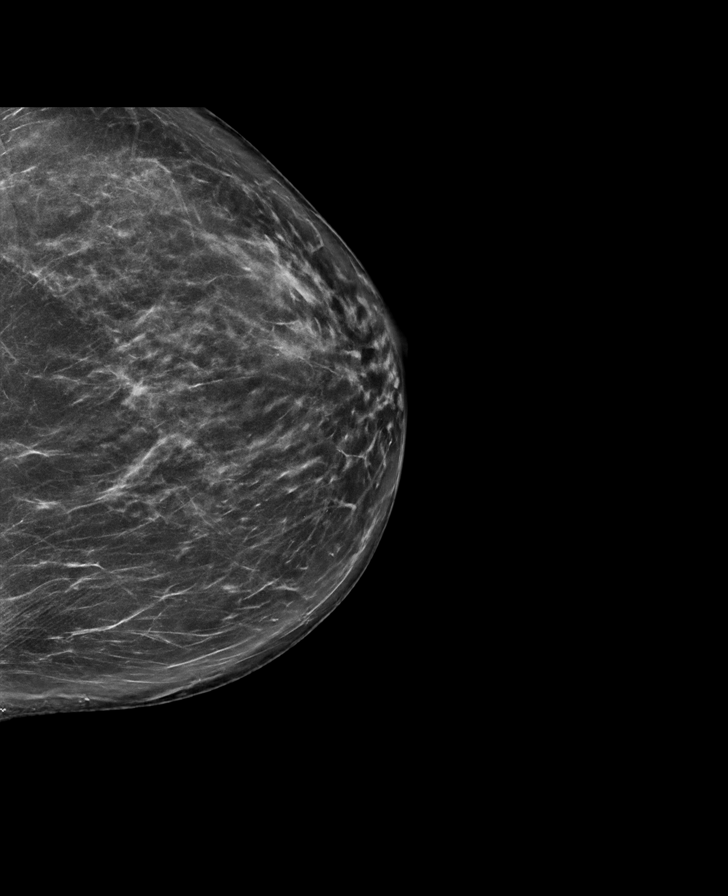

[L MLO synth-2D]
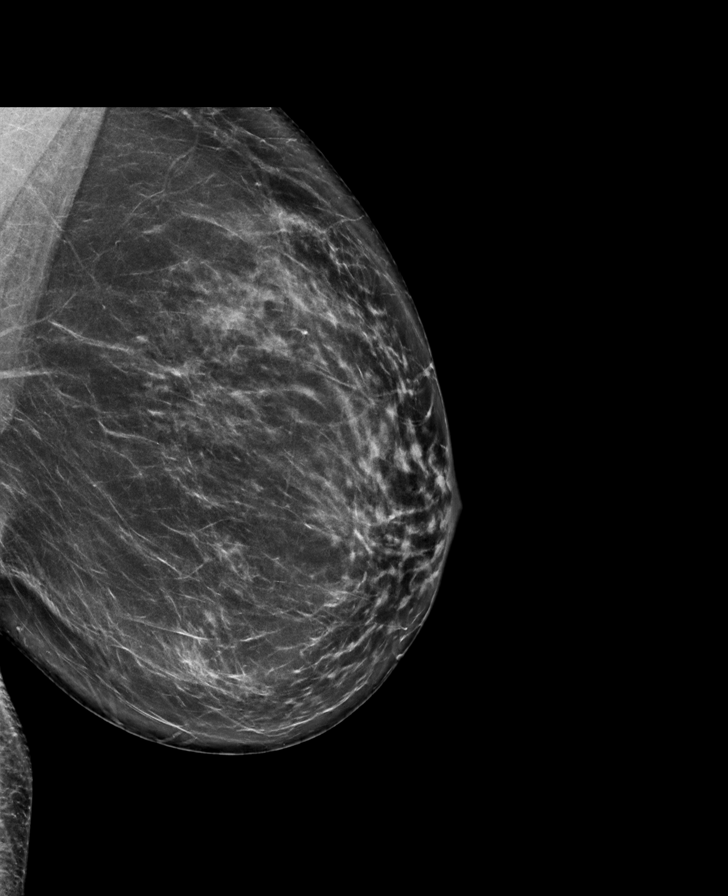

[R MLO synth-2D]
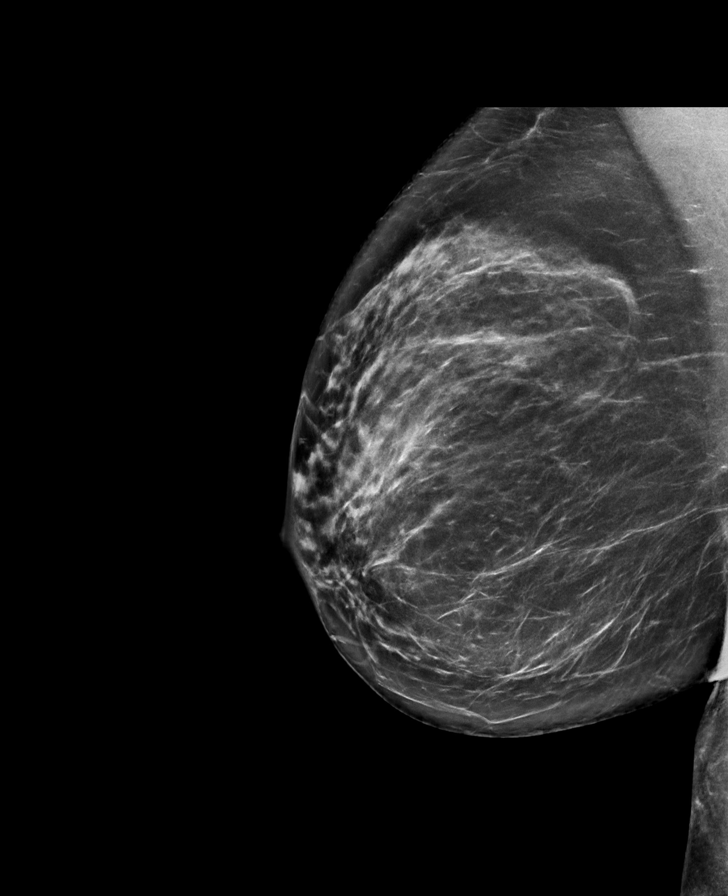

[L CC tomo · tomo slice 41/82.0]
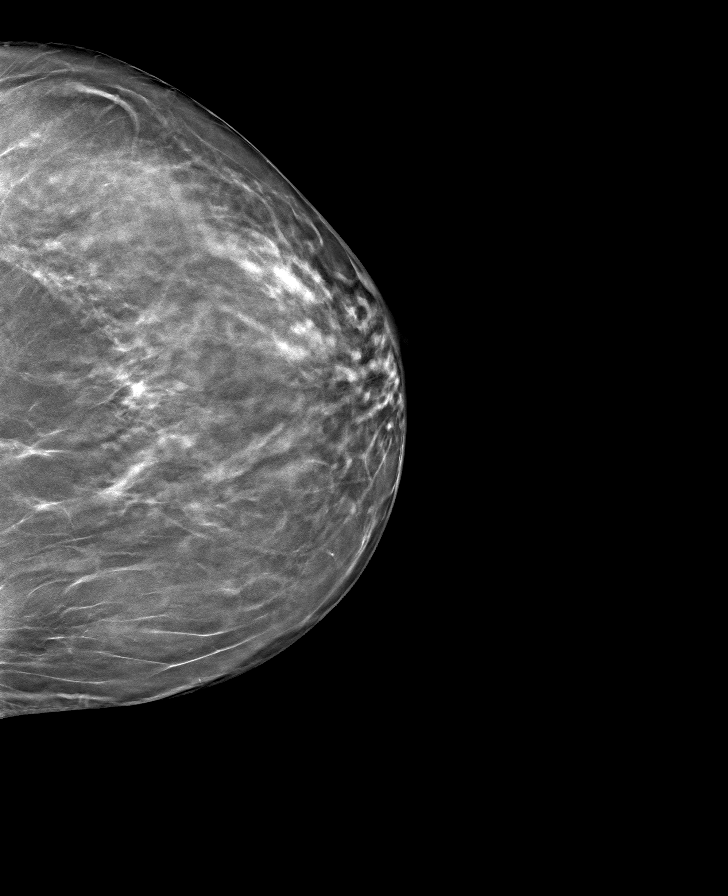

[L MLO tomo · tomo slice 42/83.0]
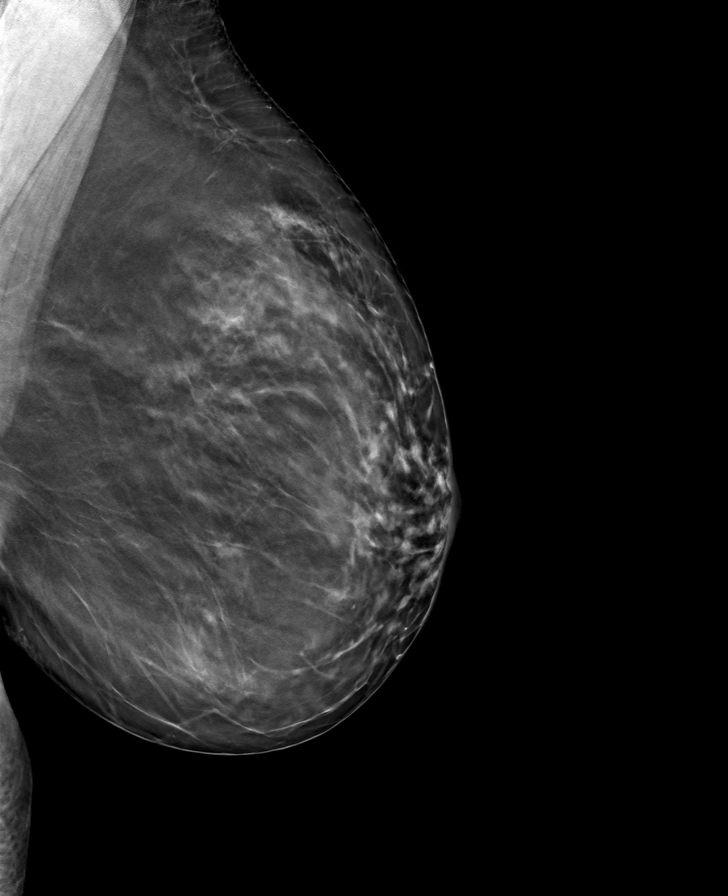

[R MLO tomo · tomo slice 41/82.0]
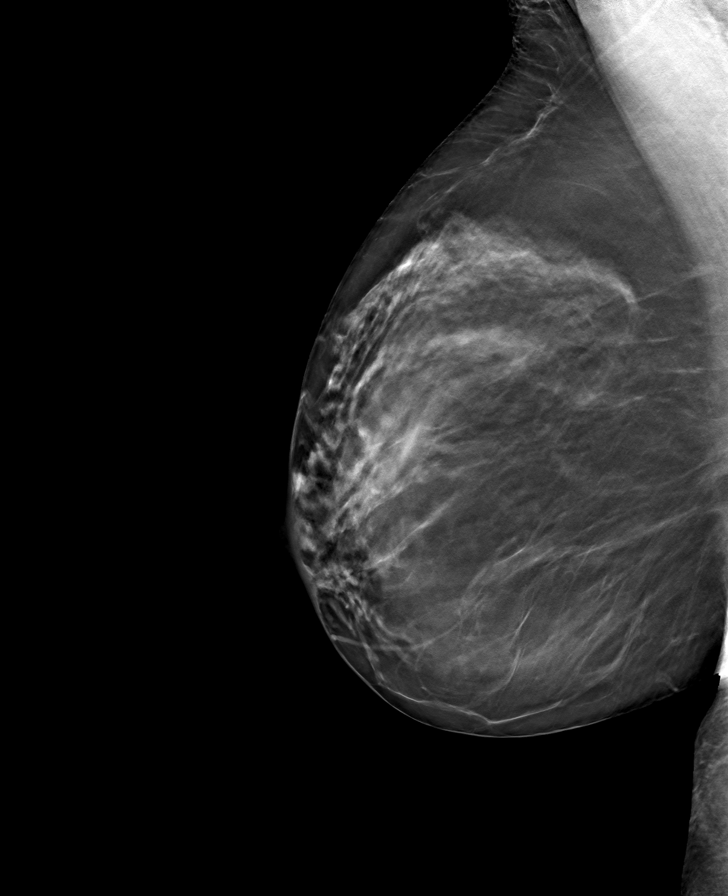

[R CC tomo · tomo slice 39/78.0]
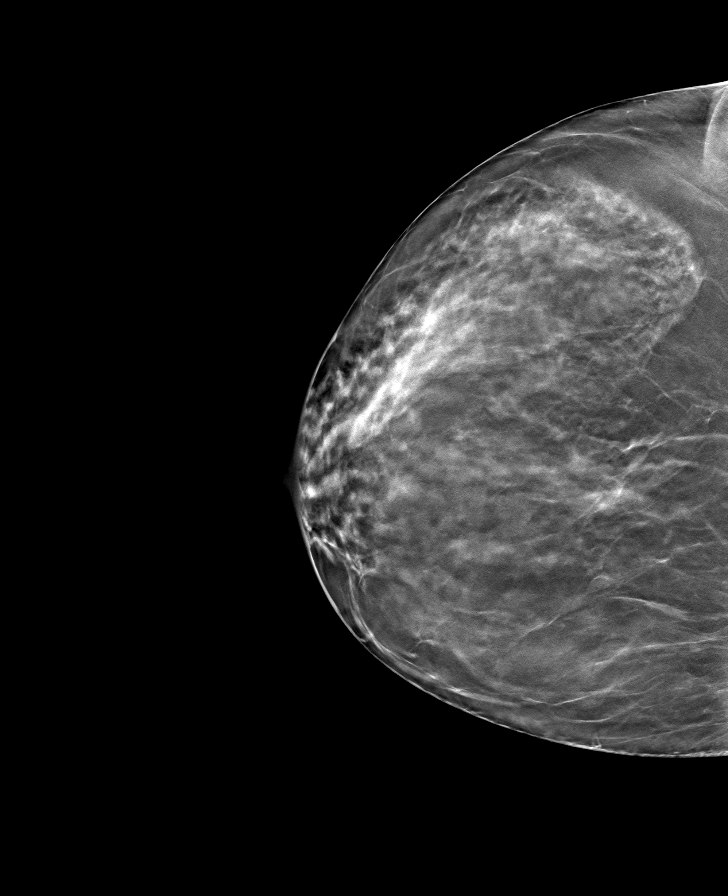

[8 of 24 positions shown; findings below may reference images not displayed]

ACR Breast Density Category c: The breast tissue is heterogeneously
dense, which may obscure small masses.
FINDINGS: There are no findings suspicious for malignancy.
IMPRESSION: No mammographic evidence of malignancy. A result letter of this
screening mammogram will be mailed directly to the patient.

RECOMMENDATION:
Screening mammogram in one year. (Code:Q3-W-BC3)

BI-RADS CATEGORY  1: Negative.

## 2022-06-10 ENCOUNTER — Other Ambulatory Visit (HOSPITAL_BASED_OUTPATIENT_CLINIC_OR_DEPARTMENT_OTHER): Payer: Self-pay

## 2022-06-10 MED ORDER — SEMAGLUTIDE-WEIGHT MANAGEMENT 0.25 MG/0.5ML ~~LOC~~ SOAJ
0.2500 mg | SUBCUTANEOUS | 2 refills | Status: AC
  Filled 2022-06-10: qty 2, 28d supply, fill #0

## 2022-06-11 ENCOUNTER — Encounter (HOSPITAL_BASED_OUTPATIENT_CLINIC_OR_DEPARTMENT_OTHER): Payer: Self-pay

## 2022-06-11 ENCOUNTER — Other Ambulatory Visit (HOSPITAL_BASED_OUTPATIENT_CLINIC_OR_DEPARTMENT_OTHER): Payer: Self-pay

## 2022-06-14 ENCOUNTER — Other Ambulatory Visit (HOSPITAL_BASED_OUTPATIENT_CLINIC_OR_DEPARTMENT_OTHER): Payer: Self-pay

## 2022-06-17 ENCOUNTER — Other Ambulatory Visit (HOSPITAL_BASED_OUTPATIENT_CLINIC_OR_DEPARTMENT_OTHER): Payer: Self-pay
